# Patient Record
Sex: Female | Born: 1965 | Race: White | Hispanic: No | Marital: Married | Smoking: Current every day smoker
Health system: Southern US, Community
[De-identification: ages and names within clinical notes are randomized; demographics above are authoritative.]

## PROBLEM LIST (undated history)

## (undated) DIAGNOSIS — D649 Anemia, unspecified: Secondary | ICD-10-CM

## (undated) DIAGNOSIS — F419 Anxiety disorder, unspecified: Secondary | ICD-10-CM

## (undated) DIAGNOSIS — O903 Peripartum cardiomyopathy: Secondary | ICD-10-CM

## (undated) DIAGNOSIS — I509 Heart failure, unspecified: Secondary | ICD-10-CM

## (undated) DIAGNOSIS — I10 Essential (primary) hypertension: Secondary | ICD-10-CM

## (undated) HISTORY — DX: Anemia, unspecified: D64.9

## (undated) HISTORY — DX: Heart failure, unspecified: I50.9

## (undated) HISTORY — DX: Essential (primary) hypertension: I10

## (undated) HISTORY — PX: BUNIONECTOMY: SHX129

## (undated) HISTORY — DX: Peripartum cardiomyopathy: O90.3

## (undated) HISTORY — DX: Anxiety disorder, unspecified: F41.9

---

## 2001-05-23 DIAGNOSIS — O903 Peripartum cardiomyopathy: Secondary | ICD-10-CM

## 2001-05-23 HISTORY — DX: Peripartum cardiomyopathy: O90.3

## 2001-09-25 ENCOUNTER — Other Ambulatory Visit: Admission: RE | Admit: 2001-09-25 | Discharge: 2001-09-25 | Payer: Self-pay | Admitting: Obstetrics and Gynecology

## 2002-01-10 ENCOUNTER — Encounter (HOSPITAL_COMMUNITY): Admission: AD | Admit: 2002-01-10 | Discharge: 2002-02-09 | Payer: Self-pay | Admitting: Obstetrics and Gynecology

## 2002-02-12 ENCOUNTER — Encounter (HOSPITAL_COMMUNITY): Admission: RE | Admit: 2002-02-12 | Discharge: 2002-02-22 | Payer: Self-pay | Admitting: Obstetrics and Gynecology

## 2002-02-14 ENCOUNTER — Inpatient Hospital Stay (HOSPITAL_COMMUNITY): Admission: AD | Admit: 2002-02-14 | Discharge: 2002-02-14 | Payer: Self-pay | Admitting: Obstetrics and Gynecology

## 2002-02-24 ENCOUNTER — Inpatient Hospital Stay (HOSPITAL_COMMUNITY): Admission: AD | Admit: 2002-02-24 | Discharge: 2002-02-28 | Payer: Self-pay | Admitting: Obstetrics and Gynecology

## 2002-03-05 ENCOUNTER — Encounter: Payer: Self-pay | Admitting: Emergency Medicine

## 2002-03-05 ENCOUNTER — Inpatient Hospital Stay (HOSPITAL_COMMUNITY): Admission: EM | Admit: 2002-03-05 | Discharge: 2002-03-08 | Payer: Self-pay | Admitting: Emergency Medicine

## 2002-03-07 ENCOUNTER — Encounter: Payer: Self-pay | Admitting: Internal Medicine

## 2002-04-01 ENCOUNTER — Other Ambulatory Visit: Admission: RE | Admit: 2002-04-01 | Discharge: 2002-04-01 | Payer: Self-pay | Admitting: Obstetrics and Gynecology

## 2002-05-17 ENCOUNTER — Emergency Department (HOSPITAL_COMMUNITY): Admission: EM | Admit: 2002-05-17 | Discharge: 2002-05-17 | Payer: Self-pay | Admitting: Emergency Medicine

## 2002-06-25 ENCOUNTER — Inpatient Hospital Stay (HOSPITAL_COMMUNITY): Admission: EM | Admit: 2002-06-25 | Discharge: 2002-06-29 | Payer: Self-pay | Admitting: Psychiatry

## 2002-06-25 ENCOUNTER — Encounter: Payer: Self-pay | Admitting: Emergency Medicine

## 2002-11-21 ENCOUNTER — Inpatient Hospital Stay (HOSPITAL_COMMUNITY): Admission: EM | Admit: 2002-11-21 | Discharge: 2002-11-24 | Payer: Self-pay | Admitting: Psychiatry

## 2003-03-11 ENCOUNTER — Ambulatory Visit (HOSPITAL_BASED_OUTPATIENT_CLINIC_OR_DEPARTMENT_OTHER): Admission: RE | Admit: 2003-03-11 | Discharge: 2003-03-11 | Payer: Self-pay | Admitting: Orthopedic Surgery

## 2003-10-21 ENCOUNTER — Other Ambulatory Visit: Admission: RE | Admit: 2003-10-21 | Discharge: 2003-10-21 | Payer: Self-pay | Admitting: Obstetrics and Gynecology

## 2003-11-10 ENCOUNTER — Emergency Department (HOSPITAL_COMMUNITY): Admission: EM | Admit: 2003-11-10 | Discharge: 2003-11-10 | Payer: Self-pay | Admitting: Emergency Medicine

## 2004-04-06 ENCOUNTER — Encounter (INDEPENDENT_AMBULATORY_CARE_PROVIDER_SITE_OTHER): Payer: Self-pay | Admitting: *Deleted

## 2004-04-06 ENCOUNTER — Ambulatory Visit (HOSPITAL_COMMUNITY): Admission: RE | Admit: 2004-04-06 | Discharge: 2004-04-06 | Payer: Self-pay | Admitting: Gastroenterology

## 2005-03-22 ENCOUNTER — Other Ambulatory Visit: Admission: RE | Admit: 2005-03-22 | Discharge: 2005-03-22 | Payer: Self-pay | Admitting: Obstetrics and Gynecology

## 2007-12-01 ENCOUNTER — Emergency Department (HOSPITAL_COMMUNITY): Admission: EM | Admit: 2007-12-01 | Discharge: 2007-12-01 | Payer: Self-pay | Admitting: Emergency Medicine

## 2008-11-03 ENCOUNTER — Emergency Department (HOSPITAL_COMMUNITY): Admission: EM | Admit: 2008-11-03 | Discharge: 2008-11-04 | Payer: Self-pay | Admitting: Emergency Medicine

## 2010-03-11 ENCOUNTER — Other Ambulatory Visit: Admission: RE | Admit: 2010-03-11 | Discharge: 2010-03-11 | Payer: Self-pay | Admitting: Family Medicine

## 2010-10-08 NOTE — Op Note (Signed)
NAME:  [name], [name]                           ACCOUNT NO.:  [id number]   MEDICAL RECORD NO.:  [id number]                   PATIENT TYPE:  AMB   LOCATION:  DSC                                  FACILITY:  [facility]   PHYSICIAN:  Leonides Grills, M.D.                  DATE OF BIRTH:  Jul 13, 1965   DATE OF PROCEDURE:  03/11/2003  DATE OF DISCHARGE:                                 OPERATIVE REPORT   PREOPERATIVE DIAGNOSES:  1. Bilateral hallux valgus.  2. Bilateral 2nd metatarsalgias.  3. Bilateral 2nd hammertoes.   POSTOPERATIVE DIAGNOSES:  1. Bilateral hallux valgus.  2. Bilateral 2nd metatarsalgias.  3. Bilateral 2nd hammertoes.   OPERATION:  1. Bilateral chevron bunionectomies.  2. Bilateral Weil 2nd metatarsal shortening osteotomies.  3. Bilateral 2nd toe MTP joint dorsal capsulotomy with collateral release.  4. Bilateral 2nd toe EDB to EDL transfer.  5. Stress x-rays bilateral feet.   ANESTHESIA:  General endotracheal tube.   SURGEON:  Leonides Grills, M.D.   ASSISTANT:  Lianne Cure, P.A.-C.   ESTIMATED BLOOD LOSS:  Minimal.   TOURNIQUET TIME:  Approximately 1 hour on each side.   COMPLICATIONS:  None, disposition stable to the PR.   INDICATIONS FOR PROCEDURE:  This is a 45 year old female who has had  longstanding medial great toe pain as well as plantar forefoot pain  underneath the 2nd metatarsal head with  a subtle hammertoe. She was  consented for the above procedure. All risks which include intervention,  neurovessel injury, persistent pain, worsening pain, stiffness, avascular  necrosis of the head, nonunion, malunion of hardware or failure and  arthritis were all explained and questions were encouraged and answered.   DESCRIPTION OF PROCEDURE:  The patient was brought to the operating room and  placed in the supine position. After adequate general endotracheal tube  anesthesia was administered as well as Ancef 1 gm IV piggyback the bilateral  lower extremities  were prepped and draped in a sterile manner over a  proximally  placed thigh tourniquet.   We started with the right side. The limb was gravity exsanguinated and the  tourniquet was elevated to 290 mmHg specifically on the right side. A  longitudinal incision midline over the medial aspect of the right great toe  MTP joint  was then made. Dissection was carried out through the skin. The  neurovascular structures were identified both superiorly and inferiorly and  protected throughout the case. An L-shaped capsulotomy was then made.   A lateral  capsular release was then done with the curved Beaver. A simple  bunionectomy was then performed with a sagittal saw. The  center of the head  was identified and a chevron osteotomy was then created with the sagittal  saw. The head was then translated proximally 3 to 4 mm laterally and then  fixed with a 2.0-mm fully threaded cortical screw using a 1.5-mm  drill hole  respectively, countersinking the head. This had excellent fixation.   The redundant bone was then removed with the sagittal saw. The Rocky Link Johnson's  ridge was rounded off with a rongeur. The area was copiously irrigated with  normal saline. The toe had excellent range of motion. The capsule was then  advanced superiorly and proximally  and sutured with 2-0 Vicryl suture.   We then made a longitudinal incision over the 2nd toe. Dissection was  carried down through the skin. The extensor digitorum longus was tenotomized  proximal medial and the brevis distal lateral. A dorsal capsulotomy with  collateral release was then performed with a #15 blade  scalpel.   We then with a stacked sagittal saw blade made an osteotomy dorsal  intraarticular parallel with the plantar aspect of the foot. Once the  osteotomy was done the head was then translated proximally 3 mm proximal.  This was then fixed with a 12-mm long [place] partially threaded screw.  This had excellent fixation. The redundant  bone dorsally was then rongeured  off with a rongeur.   The wound was irrigated copiously with normal saline. The extensor digitorum  brevis was transferred to the extensor digitorum longus with 3-0 Maxon  suture. Stress x-rays were obtained on the right side and showed excellent  alignment of all fixation as well as correction. The wounds were copiously  irrigated with normal saline. The skin was closed with 4-0 nylon suture.  Prior to this closure the tourniquet was deflated and hemostasis was  obtained. The same exact procedure was performed on the left side as well,  both of which were done without complication.   The toes pinked up pink and beautiful with excellent capillary refill.  Dressings were applied. A Roger Mann dressing was applied. A hard sole shoe  was applied to both extremities. The patient was stable to the PR.                                               Leonides Grills, M.D.    PB/MEDQ  D:  03/11/2003  T:  03/11/2003  Job:  [id number]

## 2010-10-08 NOTE — Discharge Summary (Signed)
NAME:  [name], [name] NO.:  [id number]   MEDICAL RECORD NO.:  [id number]                   PATIENT TYPE:  IPS   LOCATION:  0505                                 FACILITY:  [facility]   PHYSICIAN:  Geoffery Lyons, M.D.                   DATE OF BIRTH:  08-13-1965   DATE OF ADMISSION:  06/25/2002  DATE OF DISCHARGE:  06/29/2002                                 DISCHARGE SUMMARY   CHIEF COMPLAINT AND PRESENT ILLNESS:  This was the first admission to [facility] for this 45 year old female voluntarily admitted,  referred by Dr. Milagros Evener as she used up her Xanax early, 2 mg 3-4 times  daily four months ago.  Was tapering down but still used up the prescription  early.  Also admits to alcohol use/abuse since age 5.  Longest time sober  unclear.  Denies suicidal or homicidal ideation, auditory or visual  hallucinations.  Admits to decreased appetite and chronic insomnia.   PAST PSYCHIATRIC HISTORY:  Dr. Evelene Croon, outpatient.  This is the first  inpatient.  History of anorexia and purging in the past.  Has had a history  of overdose in the distant past.   ALCOHOL/DRUG HISTORY:  As already stated, alcohol and Xanax.  Denies any  other substances.   PAST MEDICAL HISTORY:  Hypertension, chronic congestive heart failure.   MEDICATIONS:  Xanax 0.5 mg three times a day, Zoloft 200 mg every day, Coreg  and Altace 10 mg daily.   PHYSICAL EXAMINATION:  Performed and failed to show any acute findings.   MENTAL STATUS EXAM:  Tearful, disheveled, anxious female resistant to the  interview.  Somewhat agitated, detoxing, irritable.  Speech normal  production and tempo.  Affect anxious, tearful.  Mood irritable, anxious.  Thought processes logical and coherent.  Denied any suicidal or homicidal  ideation.  No evidence of delusional ideas.  Cognition well-preserved.   ADMISSION DIAGNOSES:   AXIS I:  Alcohol and benzodiazepine abuse; rule out dependence.   AXIS II:  No diagnosis.   AXIS III:  1. Hypertension.  2. Postpartum.  3. Congestive heart failure.   AXIS IV:  Moderate.   AXIS V:  Global Assessment of Functioning upon admission 35; highest Global  Assessment of Functioning in the last year 62.   LABORATORY DATA:  Thyroid profile within normal limits.  Other labs were  within normal limits.   HOSPITAL COURSE:  She was admitted and started intensive individual and  group psychotherapy.  She was detoxified using phenobarbital.  She was  extremely agitated, reminding that she will be discharged.  The next day,  she was admitting to the uncontrolled use of Xanax to handle the anxiety,  using Xanax five times a day and then some extra ones and she admitted that  she has little tolerance to anxiety or any physical discomfort.  She  would  take up to 17 Xanax in 24 hours.  She also used alcohol, although she  minimized.  Her mood was anxious, depressed.  Her affect was anxious,  depressed, overwhelmed with everything that was going on, unable to handle  the anxiety.  We continued to detox using phenobarbital.  She was given some  Zyprexa.  Continued to evidence anxiety, agitation, building up to acute  panic, feeling out of control.  Not sleeping well.  Worrying.  She was  motivated to get herself together as she had her 59-month-old baby.  She was  given Zyprexa and Vistaril together successfully.  She was started then on  Neurontin 100 mg three times a day.  She was seen by internal medicine for  further medication and medical management.  There was a session with the  husband where she denied any suicidal or homicidal ideation.  As she  continued to be detoxed, she became much more stable.  Her mood became  euthymic.  Her affect was bright, broad.  She was not complaining anymore of  the anxiety or the panic.  So after the session with the husband went well,  she was discharged to outpatient follow-up.   DISCHARGE DIAGNOSES:    AXIS I:  1. Benzodiazepine and alcohol dependence.  2. Anxiety disorder not otherwise specified.   AXIS II:  No diagnosis.   AXIS III:  1. Hypertension.  2. Postpartum.  3. Chronic congestive heart failure.   AXIS IV:  Moderate.   AXIS V:  Global Assessment of Functioning upon discharge 55.   DISCHARGE MEDICATIONS:  1. Coreg 18.75 mg twice a day.  2. Zoloft 200 mg daily.  3. Altace 10 mg twice a day.  4. Neurontin 300 mg three times a day.  5. Zyprexa Zydis 5 mg every six hours as needed.  6. Vistaril 50 mg every six hours as needed.   FOLLOW UP:  Milagros Evener, M.D. and Sallye Ober _______ for psychotherapy.                                               Geoffery Lyons, M.D.    IL/MEDQ  D:  07/31/2002  T:  08/01/2002  Job:  [id number]

## 2010-10-08 NOTE — Op Note (Signed)
[name], [name]                           ACCOUNT NO.:  [id number]   MEDICAL RECORD NO.:  [id number]                   PATIENT TYPE:  INP   LOCATION:  NA                                   FACILITY:  [facility]   PHYSICIAN:  James A. Ashley Royalty, M.D.             DATE OF BIRTH:  20-Jul-1965   DATE OF PROCEDURE:  02/25/2002  DATE OF DISCHARGE:                                 OPERATIVE REPORT   PREOPERATIVE DIAGNOSES:  1. Intrauterine pregnancy at [redacted] weeks gestation.  2. Arrest disorder of dilatation in labor.   POSTOPERATIVE DIAGNOSES:  1. Intrauterine pregnancy at [redacted] weeks gestation.  2. Arrest disorder of dilatation in labor.   PROCEDURE:  Primary low transverse cesarean section.   SURGEON:  Rudy Jew. Ashley Royalty, M.D.   ANESTHESIA:  Epidural and general.   FINDINGS:  An 8 pound 5 ounce female.  Apgars 8 at one minute, 9 at five  minutes.  Sent to the newborn nursery.   ESTIMATED BLOOD LOSS:  800 cc.   COMPLICATIONS:  None.   PACKS AND DRAINS:  Foley.   COUNTS:  Sponge, needle, and instrument count reported as correct x2.   PROCEDURE:  The patient was taken to the operating room and placed in the  dorsal supine position.  __________  epidural was placed.  Foley catheter  was also in place.  She was prepped and draped in the usual manner for  abdominal surgery.  Attempt was made to achieve surgical levels of  anesthesia with the epidural catheter.  However, this effort was not  successful.  Hence, the patient was ultimately placed under general  anesthesia.   A Pfannenstiel incision was made down to the level of the fascia.  The  fascia was nicked with the knife and incised transversely with Mayo  scissors.  The underlying rectus muscles were separated from the overlying  fascia using sharp and blunt dissection.  The rectus muscles were separated  in the midline exposing the peritoneum which was elevated with hemostats and  entered atraumatically with Metzenbaum scissors.  The  incision was extended  longitudinally.  The uterus was identified and bladder flap created by  incising the anterior uterine serosa and sharply and bluntly dissecting the  bladder inferiorly.  It was held in place with a bladder blade.  The uterus  was then entered through a low transverse incision using sharp and blunt  dissection.  The amniotic fluid was noted to be clear.  The infant was  delivered from a vertex presentation in an atraumatic manner.  The infant  was suctioned.  The cord was clamped, cut, and the infant given to the  waiting pediatrics team.  Cord blood was obtained.  The placenta and  membranes were then removed in their entirety.  The uterus was exteriorized.  The uterus was then closed in two running layers of number 1 Vicryl.  The  first was a running locking layer.  The second was a running, intermittently  locking, and embrocating layer.  Two additional figure-of-eight sutures were  required to obtain hemostasis.  Hemostasis was noted.  The uterus, tubes,  and ovaries were inspected and found to be normal and returned to the  abdominal cavity.  Copious irrigation was accomplished.  Hemostasis was  noted.   The fascia was then closed with 0 Vicryl in a running fashion.  The skin was  closed with staples.  At the conclusion of the procedure the urine was clear  and copious.  The patient was taken to the recovery room in excellent  condition.                                               James A. Ashley Royalty, M.D.    JAM/MEDQ  D:  02/26/2002  T:  02/26/2002  Job:  [id number]

## 2010-10-08 NOTE — Consult Note (Signed)
[name], [name]                           ACCOUNT NO.:  [id number]   MEDICAL RECORD NO.:  [id number]                   PATIENT TYPE:  INP   LOCATION:  0158                                 FACILITY:  [facility]   PHYSICIAN:  Meade Maw, M.D.                 DATE OF BIRTH:  February 21, 1966   DATE OF CONSULTATION:  03/05/2002  DATE OF DISCHARGE:                                   CONSULTATION   INDICATION FOR CONSULTATION:  Congestive heart failure.   HISTORY OF PRESENT ILLNESS:  The patient is a very pleasant 45 year old  female who has recently underwent a cesarean section at 40 weeks' gestation.  She was noted to have significant hypertension during her third trimester.  The patient has a 3-year history of hypertension treated by Dr. Quintella Reichert with  Ziac prior to her pregnancy.  During the pregnancy her Ziac was discontinued  and she was subsequently started on Aldomet.  The Aldomet was increased to  500 mg t.i.d.  The patient is unable to relate what her blood pressure  typically ran throughout pregnancy.  During her cesarean section she was  noted that her blood pressure was up, approximately 180/110.  On Wednesday  and Thursday she noted the sudden onset of the PND and orthopnea.  She  attributed this to anxiety.  She was also noted to have increased ankle  swelling and her blood pressure remained elevated throughout her postpartum  period.  On the night of presentation at midnight, she said that she became  severely short of breath and was unable to lay flat.  She felt as if she was  drowning.  She complained of a subscapular pain but no real chest pain.  Her  blood pressure in the emergency room was noted to be 190/130.  She was  treated with IV Lasix and sublingual nitroglycerin and subsequently started  on IV nitro.  Since she has been given the nitro, she has developed a severe  headache to the point that it is almost debilitating.  A transthoracic echo  has been performed.  The  echo was of poor quality.  She does appear to have  an ejection fraction of approximately 30%.  Her left ventricle is mild to  moderately dilated.  She has been diuresed with a total of 60 mg of Lasix  today for a total of 3400 cc.  Her shortness of breath has significantly  improved.  Her chief complaint at this time, as noted above, is that of  severe headache.  She has a history of tobacco and alcohol abuse prior to  her pregnancy.   PAST MEDICAL HISTORY:  As noted above.  1. One week postpartum.  2. Tobacco abuse prior to pregnancy.  3. Alcohol abuse prior to pregnancy.  4. Hypertension.  5. Panic disorder.   MEDICATIONS:  Prior to this admission include Aldomet 500 mg t.i.d. and  prenatal vitamins.   CURRENT MEDICATIONS:  1. IV nitroglycerin.  2. Altace 5 mg p.o. b.i.d.  3. Xanax p.r.n.  4. Zoloft 25 mg p.o. q.d.  5. Potassium supplement.  6. Morphine p.r.n.  7. Tylenol p.r.n.   ALLERGIES:  No known drug allergies.   PAST SURGICAL HISTORY:  Significant for cesarean section, as noted above.   FAMILY HISTORY:  Father has a history of CABG with dyslipidemia, died from  lung cancer.  Mother had somatization disorder.  Brother had increased  cholesterol.   SOCIAL HISTORY:  She smoked two packs per day, stopped when she became  pregnant.  Alcohol prior to pregnancy.  She denies drug abuse.   REVIEW OF SYSTEMS:  As noted above.   PHYSICAL EXAMINATION:  GENERAL:  Middle-aged female with significant  emotional distress.  She is tearful throughout the interview.  VITAL SIGNS:  Her systolic pressure was ranging from 160-170 with a  diastolic of 90s-120, heart rate of 84-110.  O2 saturation is 96% on room  air.  HEENT:  Unremarkable.  NECK:  There is no evidence of neck vein distention.  She has good carotid  upstrokes.  No carotid bruits.  Thyroid is not palpable.  PULMONARY:  Breath sounds which are equal.  There are mild crackles noted at  the base.  CARDIOVASCULAR:   Regular rate and rhythm.  There is no obvious gallop noted.  ABDOMEN:  Post gravid state.  She has normal bowel sounds.  EXTREMITIES:  No peripheral edema.  NEUROLOGICAL:  Nonfocal.  SKIN:  Warm and dry.   LABORATORY DATA:  Her CK peak is 155 with an MB of 6.2.  Her troponin I is  slightly elevated at 0.05.  Her BNP is elevated at 1750.  Her white count is  9.2, hematocrit 31, platelet count has increased from 295 to 771.  Potassium  has decreased from admission from 3.4 to 2.9.   OTHER DIAGNOSTIC DATA:  Transthoracic echo is as noted above.  Chest x-ray  reveals pulmonary edema.  EKG reveals sinus tachycardia.  There was no  inducible ischemia.   IMPRESSION:  1. 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  Other etiologies to     consider are hypertensive cardiomyopathy as well as alcohol-induced     cardiomyopathy.  At this time, we will continue with aggressive medical     treatment including IV diuresis, hypertensive control.  Her Altace has     been increased to 10 mg b.i.d.  She has been ordered Vasotec p.r.n. and     will continue with aggressive diuresis.  Will obtain a repeat BNP to     monitor her therapy.  Additionally, she has been started on Lopressor 25     mg b.i.d. Will discontinue the nitroglycerin as ischemic etiology is the     likelihood.  Will consider a left and right heart catheterization should     she not have significant improvement in her ejection fraction once she is     stabilized.  The patient has been counseled that should she wish to     continue breast feeding, she should discard all milk during this period.  2.     Hypertension.  Increase in Altace, as noted above.  Aggressive diuresis and      increase in Lopressor.  She will need aggressive replacement for her     hypokalemia.  3. Headaches.  Will discontinue the nitro as noted above, add Darvocet-N 100  p.r.n.                                              Meade Maw, M.D.     HP/MEDQ  D:  03/05/2002  T:  03/05/2002  Job:  [id number]   cc:   Lacretia Leigh. Quintella Reichert, M.D.   James A. Ashley Royalty, M.D.  [address]  [place], [place] [address]  Fax: [phone]

## 2010-10-08 NOTE — Discharge Summary (Signed)
NAME:  [name], [name]                           ACCOUNT NO.:  [id number]   MEDICAL RECORD NO.:  [id number]                   PATIENT TYPE:  IPS   LOCATION:  0508                                 FACILITY[facility]   PHYSICIAN:  Geoffery Lyons, M.D.                   DATE OF BIRTH:  1966-03-28   DATE OF ADMISSION:  11/21/2002  DATE OF DISCHARGE:  11/24/2002                                 DISCHARGE SUMMARY   CHIEF COMPLAINT AND PRESENT ILLNESS:  This was the second admission to [facility] for this 45 year old married white female voluntarily  admitted.  History of alcohol and drug use.  Has been drinking beer and wine  and drinking shots throughout the night.  Has been smoking crack cocaine.  Feeling very depressed.  A friend recently died.  Her sleep has been poor.  Has had an intentional weight loss of 50 pounds.  She takes Tylenol, up to  four liquid gel tabs, to sleep.  Denies any suicidal or homicidal thoughts  or psychosis.   PAST PSYCHIATRIC HISTORY:  Second time admitted.  Detoxed from Xanax.  Saw  Dr. Donell Beers on an outpatient basis.  History of alcohol detox in the past.   ALCOHOL/DRUG HISTORY:  Has been drinking beer, wine, drinking shots and  smoking crack cocaine.   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   MEDICATIONS:  Altace 10 mg twice a day, Lexapro 20 mg and Klonopin 1 mg  twice a day.   PHYSICAL EXAMINATION:  Performed and failed to show any acute findings.   MENTAL STATUS EXAM:  Alert female.  Cooperative, casually dressed with fair  eye contact.  Speech is clear.  Mood is depressed.  Affect is flat.  Thought  processes are coherent.  No evidence of psychosis.  Cognition well-  preserved.  Minimizing the alcohol use.   ADMISSION DIAGNOSES:   AXIS I:  1. Alcohol, benzodiazepine and cocaine dependence.  2. Mood disorder not otherwise specified.   AXIS II:  No diagnosis.   AXIS  III:  1. Congestive heart failure.  2. Hypertension.   AXIS IV:  Moderate.   AXIS V:  Global Assessment of Functioning upon admission 35; highest Global  Assessment of Functioning in the last year 70.   LABORATORY DATA:  Blood chemistries within normal limits.  Thyroid profile  within normal limits.   HOSPITAL COURSE:  She was admitted and started intensive individual and  group psychotherapy.  She was detoxified using Librium.  She was given some  Vistaril, some trazodone as well as some Symmetrel.  She was given some  Seroquel as needed.  She was placed on Lexapro 20 mg per day, Altace 10 mg  twice a day, Seroquel 50 mg at bedtime.  Lexapro was increased to 25 mg per  day.  She was  started on Neurontin 100 mg four times a day and she was given  Seroquel 100 mg as needed for anxiety.  She was, throughout the stay,  minimizing the pattern of substance use.  She apparently was trying to  control drinking but, at the same time, she was using the Klonopin she was  being prescribed.  She went up on the Klonopin, up to 5 mg per day.  She  developed tolerance to the alcohol and the Klonopin, so she started using  cocaine.  Did not see a problem with the Klonopin being prescribed to her,  in spite of having had to be detoxed on Xanax before and the fact that she  was already abusing the Klonopin.  She minimized, rationalized, projected,  blamed, was easily agitated.  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  On November 24, 2002, she had settled down.  She was willing to  give it a try to be off the Klonopin.  Did feel better with the medications  that were provided for her.  She was going to go back to see Dr. Donell Beers and  will give this information to him.  Upon discharge, in full contact with  reality.  No suicidal or homicidal ideation.  Claimed to be motivated to  abstain from all substances.    DISCHARGE DIAGNOSES:   AXIS I:  1. Polysubstance dependence.  2. Mood disorder not otherwise specified.  3. Anxiety disorder not otherwise specified.   AXIS II:  No diagnosis.   AXIS III:  1. Congestive heart failure.  2. Hypertension.   AXIS IV:  Moderate.   AXIS V:  Global Assessment of Functioning upon discharge 50.   DISCHARGE MEDICATIONS:  1. Symmetrel 100 mg twice a day.  2. Altace 10 mg twice a day.  3. Lexapro 20 mg, 1-1/2 daily.  4. Neurontin 100 mg four times a day.  5. Trazodone 150 mg, 1-2 at bedtime as needed for sleep.  6. Seroquel 100 mg every six hours as needed.   FOLLOW UP:  Dr. Donell Beers.                                               Geoffery Lyons, M.D.    IL/MEDQ  D:  12/18/2002  T:  12/18/2002  Job:  [id number]

## 2010-10-08 NOTE — Discharge Summary (Signed)
[name], [name]                           ACCOUNT NO.:  [id number]   MEDICAL RECORD NO.:  [id number]                   PATIENT TYPE:   LOCATION:                                       FACILITY:   PHYSICIAN:  Rudy Jew. Ashley Royalty, M.D.             DATE OF BIRTH:  02-Nov-1965   DATE OF ADMISSION:  02/24/2002  DATE OF DISCHARGE:  02/28/2002                                 DISCHARGE SUMMARY   DISCHARGE DIAGNOSES:  1. Intrauterine pregnancy at 40 weeks, delivery.  2. Chronic hypertension.  3. Advanced maternal age.  4. Previous smoker.  5. History of transfusion.  6. Apparent umbilical hernia.  7. Arrest disorder of dilatation in labor necessitating cesarean section.   OPERATIONS/SPECIAL PROCEDURES:  Primary low transverse cesarean section.   CONSULTATIONS:  None.   DISCHARGE MEDICATIONS:  Chromagen, Aldomet, Percocet, Motrin.   HISTORY AND PHYSICAL:  This is a 45 year old prima gravida at [redacted] weeks  gestation.  Prenatal care was complicated by the aforementioned risk  factors.  The patient has been taking Aldomet 500 mg p.o. b.i.d. for the  majority of the pregnancy.  She just recently increased to a t.i.d. regimen.  She stopped smoking several months prior to delivery.  She is also Group B  strep culture positive.  She presented to triage on the day of admission  complaining of flashes in her vision.  She denied any frank diplopia,  scotomata, or other symptoms of severe pregnancy-induced hypertension.  She  had some discomfort beneath her right breast which resolved after arrival at  the hospital.  Physical examination revealed her blood pressure to be  162/86; later 150/73.  Vital signs were stable.  Cervical examination  revealed the cervix to be long, closed, and posterior.   HOSPITAL COURSE:  The patient was admitted to [facility] of  [place].  The patient's laboratory studies were normal.  She was given  Cytotec and Pitocin.  She went on to labor and dilated as  far as 8 cm and  minus 1 to minus 2 station.  Unfortunately she developed an arrest disorder  of dilatation which necessitated cesarean section; hence, on February 25, 2002, she was taken to the operating room and underwent primary low  transverse cesarean section.  The procedure was accomplished under epidural  anesthesia.  She delivered an 8-pound 5-ounce female, Apgars 8 at 1 minute and  9 at 5 minutes, and sent to newborn nursery.  The operation was  uncomplicated.  On February 26, 2002, the patient complained of tightness in  her chest and trouble breathing.  She felt she was having a panic attack  which she has had many times in the past.  The symptoms seemed to abate with  a dose of Xanax.  The patient was monitored closely and the symptoms did not  recur.  On February 28, 2002, she was felt stable for discharge and  was  discharged in satisfactory condition.   ACCESSORY CLINICAL FINDINGS:  Hemoglobin/hematocrit initial of 11.2 and  32.3, respectively.  Repeat values obtained throughout the hospitalization,  the last which was  performed on February 27, 2002, and yielded  values of 8.6 and 24.4, respectively.  A CM-24 revealed normal SGOT and  SGPT.  Fibrinogen was 540.  Urine culture revealed no growth.   DISPOSITION:  The patient is to return to [facility] in four to six  weeks for postpartum evaluation.                                               James A. Ashley Royalty, M.D.    JAM/MEDQ  D:  05/15/2002  T:  05/16/2002  Job:  [id number]

## 2010-10-08 NOTE — Discharge Summary (Signed)
[name], [name] NO.:  [id number]   MEDICAL RECORD NO.:  [id number]                   PATIENT TYPE:  INP   LOCATION:  2037                                 FACILITY:  [facility]   PHYSICIAN:  Meade Maw, M.D.                 DATE OF BIRTH:  11-06-1965   DATE OF ADMISSION:  03/06/2002  DATE OF DISCHARGE:  03/08/2002                                 DISCHARGE SUMMARY   PHYSICIANS:  Primary care Blakelynn Scheeler is Lacretia Leigh. Quintella Reichert, M.D.  OB/GYN is  Rudy Jew. Ashley Royalty, M.D. of [facility]: [facility].   PROCEDURES:  1. (03/05/02) 2 D echocardiogram revealing moderately dilated LV with mildly     to moderately decreased LV systolic function, EF estimated 35-45%.  Mild     MR.  2. (03/07/02) MUGA radionuclide study with first ejection fraction     calculated at 25% and second EF calculated at 28%.  Diffuse decrease in     contraction of the left ventricular wall, particularly the apicoseptal     region.  Average EF of 26.5%.  Findings consistent with cardiomyopathy.   DISCHARGE DIAGNOSES:  1. 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  Other     etiologies to consider are hypertensive cardiomyopathy as well as alcohol-     induced cardiomyopathy.  The patient was aggressively diuresed and multi-     drug regimen initiated for adequate hypertensive control.  Future     consideration will be given to a left and right heart catheterization     should she not have improvement in her ejection fraction once stabilized.  2. Hypertension.  Medications adjusted with good control by time of     discharge.  3. Hypokalemia, diuresis-induced.  Supplemented and within good range at     time of discharge.  4. History of tobacco and alcohol use prior to pregnancy.  Has abstained     successfully since.  5. History of panic disorder.  Started on Zoloft prior to pregnancy.  She     was continued on p.r.n. Xanax and will continue on Xanax with  reinitiation of Zoloft at time of discharge.  6. Elevated alkaline phosphatase, questionably related to hepatic congestion     from congestive heart failure.  7. Mild elevation in troponin I and relative index of cardiac enzymes,     likely related to congestive heart failure.   DISCHARGE MEDICATIONS:  1. Altace 10 mg p.o. b.i.d.  2. K-Dur 20 mEq one p.o. q.d.  3. Lopressor 100 mg p.o. b.i.d.  4. Furosemide 40 mg one p.o. b.i.d. for three days, then decrease to one     tablet p.o. q.d.  5. Zoloft 25 mg p.o. q.d.  6. Xanax 0.5 mg one-half to one tablet p.o. q.6h. p.r.n. anxiety.   CONDITION ON DISCHARGE:  Stable.   DISCHARGE INSTRUCTIONS:  Activity:  As tolerated.  Diet:  Limit sodium  intake to less than 3000 mg per day.  Do not eat servings containing greater  than 300 mg of sodium per serving.  Emphasize use of fresh/frozen vegetables  over canned veggies and fresh fruits.   SPECIAL INSTRUCTIONS:  The patient to call our clinic with any concerns or  problems or shortness of breath.  Patient instructed to check her weight  every day after voiding and before breakfast and bring in those lists of  weights to her office visits with her.  She is instructed to purchase a  blood pressure monitoring kit, checking her blood pressure three or four  times per week and bringing those lists of measurements in with her to  office visits.   FOLLOW-UP:  Tuesday, 10/21, at 1:15 p.m. to see Dr. Fraser Din.  She will stop  by the lab, [address], at 12:45 prior to office visit for drawing BMET.  On  Tuesday, 10/28, at 2 p.m., will see Katrine Coho in our congestive heart  failure clinic.   HISTORY OF PRESENT ILLNESS:  The patient is a pleasant 45 year old female  with a three-year history of hypertension treated by Dr. Quintella Reichert.  It had  been well-controlled on Ziac 5 mg per day.  When she became pregnant, she  was advised to discontinue her Ziac.  In the third trimester she was on  Aldomet, currently  500 mg t.i.d.  The patient is status post delivery of a  female child by C-section 02/24/02 under general anesthesia.  At that time her  blood pressure was approximately 180/110.  After discharge she noticed  sudden onset of PND/orthopnea attributed to anxiety.  She later had  subsequent episode of severe PND and increased lower extremity swelling.  The recurrent episode of orthopnea and PND precipitated ER trip to [facility], where her blood pressure was found to be 190/130, heart rate  approximately 130.   HOSPITAL COURSE:  1. 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  She was seen by cardiology (Dr. Meade Maw), who     noted low EF, approximately 30%, by transthoracic echo, with mild to     moderately dilated LV.  She had good diuresis with IV Lasix with     significant  improvement in her shortness of breath.  She did have a     nitroglycerin-induced headache.  Initial BNP was 1750 on 10/14 earlier     that morning and later that evening BNP was increased to 2820.     Medications were adjusted, including increase of her Altace, addition of     Lopressor to regimen.  A MUGA scan revealed EF averaging 26.5% with     diffuse hypokinesis compatible with cardiomyopathy.  The evening 10/16     the patient complained of shortness of breath with clinical signs of CHF.     She was diuresed with improvement.  On day of discharge her lung sounds     were clear.  She was felt to be compensated and was discharged to home in     stable condition.  Please note that her admission weight was 177 pounds;     weight at time of discharge was 164 pounds.   1. HYPOKALEMIA:  Diuretic-induced, supplemented p.r.n.  Her serum potassium     was 4.5 at time of discharge.  Her serum potassium was as low as 2.8     10/14.  1. NITROGLYCERIN-INDUCED HEADACHE:  Resolved  with discontinuation of     nitrates.   LABORATORY DATA:  On 10/14, cholesterol of 265, triglycerides 270, HDL 48,  LDL of 163, VLDL of 54.  Admission sodium of 141, potassium 3.4; serum  potassium as low as 2.8, which was supplemented.  Serum potassium of 4.5 day  of discharge.  Chloride 105, CO2 of 27, glucose 93, BUN 16, creatinine 0.6,  which remained stable during course of admission.  LFTs within normal range  though slightly elevated alkaline phosphatase at time of admission of 146.  Albumin low at 3.1.  Calcium 9.4.  On 10/14 her WBC was 9.2, hemoglobin  10.8, hematocrit 31.5, platelets elevated at 771.  Differential essentially  within normal range.  This admission, first CK of 155, MB fraction 6.2,  troponin I of 0.04.  BNP of 1750.  Second CK of 117, MB fraction of 5,  troponin I of 0.05.  Third CK of 94, MB fraction 4.2, troponin I 0.05.  Follow-up BNP later in the evening 10/14 was elevated at 2820.    NOTE:  Total time preparing for discharge greater than 40 minutes, including  dictating this discharge summary, filling out and reviewing discharge  instructions with the patient, calling in prescriptions, and arranging  follow-up office visits.       Salomon Fick, N.P.                       Meade Maw, M.D.    MES/MEDQ  D:  03/08/2002  T:  03/08/2002  Job:  [id number]   cc:   Lacretia Leigh. Quintella Reichert, M.D.   James A. Ashley Royalty, M.D.  [address]  [place], [place] [address]  Fax: [phone]

## 2010-10-08 NOTE — H&P (Signed)
NAME:  [name], MCNORTON                           ACCOUNT NO.:  [id number]   MEDICAL RECORD NO.:  [id number]                   PATIENT TYPE:  IPS   LOCATION:  0508                                 FACILITY:  [facility]   PHYSICIAN:  Geoffery Lyons, M.D.                   DATE OF BIRTH:  1965/11/01   DATE OF ADMISSION:  11/21/2002  DATE OF DISCHARGE:  11/24/2002                         PSYCHIATRIC ADMISSION ASSESSMENT   IDENTIFYING INFORMATION:  This is a 45 year old married white female  voluntarily admitted on November 21, 2002.   HISTORY OF PRESENT ILLNESS:  The patient presents with a history of alcohol  and drug use.  Has been drinking beer and wine and drinking shots,  drinking throughout the night.  She has been smoking crack cocaine, feeling  very depressed.  Her friend had recently died.  Her sleep has been poor.  She has had an intentional weight loss of 50 pounds but wants to lose more.  The patient states she takes Tylenol, up to four liquid gel tablets, to  sleep.  She denies any suicidal or homicidal thoughts or psychosis and  states that will attend meetings after discharge to remain sober.   PAST PSYCHIATRIC HISTORY:  Second hospitalization.  Was here in October,  where she was detoxed from Xanax.  She sees Dr. Donell Beers as an outpatient and  also has a therapist.  The patient has no history of being detoxed from  alcohol in the past.  Her longest history of sobriety has been one year.   SOCIAL HISTORY:  This is a 44 year old married white female.  Married for  five years.  Has a 61-month-old child.  Her husband is supportive.  She lives  with her husband and child.  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  She  reports no legal problems.  She has completed some college courses.  Denies  any DUIs.   FAMILY HISTORY:  Mother with bipolar.  Grandmother with bipolar.  Father  with bipolar.   ALCOHOL/DRUG HISTORY:  The patient smokes.  Has been drinking beer, wine,  drinking shots.  Her  last drink was on November 21, 2002.   PRIMARY CARE PHYSICIAN:  Caroline Sauger, M.D., in [place].   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   MEDICATIONS:  Altace 10 mg b.i.d., Lexapro 20 mg (has been on that for five  months but has not been on any antidepressant for the past two months),  Klonopin 1 mg b.i.d.   ALLERGIES:  No known allergies.   PHYSICAL EXAMINATION:  Done at [facility].  The  patient, today, appears in no acute distress.   LABORATORY DATA:  Her urine drug screen was positive for cocaine, positive  for benzodiazepines, positive for THC.  Alcohol level was 14.  CBC within  normal limits.   MENTAL STATUS  EXAM:  She was an alert, young female.  Cooperative, casually  dressed.  Fair eye contact.  Speech is clear.  Mood is depressed.  Affect is  flat.  Thought processes are coherent.  No evidence of psychosis.  Cognitive  function intact.  Judgment is poor.  Insight is fair.  Seems to be  minimizing her alcohol and drug use.   DIAGNOSES:   AXIS I:  1. Polysubstance abuse.  2. Rule out bipolar disorder.   AXIS II:  Deferred.   AXIS III:  1. Congestive heart failure.  2. Hypertension.   AXIS IV:  Other psychosocial problems, medical problems.   AXIS V:  Current 35; past year 70.   PLAN:  Voluntary admission for drug use and depression.  Contract for  safety.  Check every 15 minutes.  Will initiate the low dose Librium to  detox from alcohol and benzodiazepines.  Will continue with patient's other  medications and monitor her vital signs closely.  Will check labs.  The  patient is to increase coping skills by attending groups.  Will have a  family session with husband for support and discharge planning.  The patient  is to remain alcohol and drug-free, to attend AA/NA meetings after  discharge.  To be medication-compliant.  To follow up with Dr. Donell Beers and  therapist.    TENTATIVE LENGTH OF STAY:  Three to five days.     Landry Corporal, N.P.                       Geoffery Lyons, M.D.    JO/MEDQ  D:  12/09/2002  T:  12/09/2002  Job:  [id number]

## 2010-10-08 NOTE — Op Note (Signed)
[name], [name]                 ACCOUNT NO.:  [id number]   MEDICAL RECORD NO.:  [id number]          PATIENT TYPE:  AMB   LOCATION:  ENDO                         [facility]AC[facility]   PHYSICIAN:  Danise Edge, M.D.   DATE OF BIRTH:  [birth year]   DATE OF PROCEDURE:  04/06/2004  DATE OF DISCHARGE:                                 OPERATIVE REPORT   PROCEDURE:  Diagnostic colonoscopy.   INDICATIONS:  [name] is a 45 year old female born 1966/02/05. Ms.  Wilkinson is undergoing a diagnostic colonoscopy to evaluate intermittent bloody  diarrhea. Her mother had Crohn's disease. Her father was diagnosed with  colon cancer.   ENDOSCOPIST:  Danise Edge, M.D.   PREMEDICATION:  Versed 10 mg, Demerol 100 mg.   PROCEDURE NOTE:  After obtaining informed consent, [name] was placed in  the left lateral decubitus position. I administered intravenous Demerol and  intravenous Versed to achieve conscious sedation for the procedure. The  patient's blood pressure, oxygen saturation, and cardiac rhythm were  monitored throughout the procedure and documented in the medical record.   Anal inspection and digital rectal exam were normal. The Olympus adjustable  pediatric colonoscopy was introduced into the rectum and advanced to the  cecum. A normal appearing ileocecal valve was intubated, and the distal  ileum inspected. Colonic preparation for exam today was excellent.   Rectum:  Normal.   Sigmoid colon and descending colon:  Normal.   Splenic flexure:  Normal.   Transverse colon:  Normal.   Hepatic flexure:  Normal.   Ascending colon:  Normal.   Cecum and ileocecal valve:  Normal.   Distal ileum:  Normal.   BIOPSIES:  Four biopsies were taken from the right colon and four biopsies  were taken from the left colon to rule out microscopic colitis.   ASSESSMENT:  Normal proctocolonoscopy with distal ileoscopy. Random colonic  biopsies to rule out microscopic colitis  pending.      MJ/MEDQ  D:  04/06/2004  T:  04/06/2004  Job:  [id number]   cc:   Holley Bouche, M.D.  [address]  [place], [place] [address]  Fax: [phone]

## 2010-12-09 ENCOUNTER — Emergency Department (HOSPITAL_COMMUNITY): Admission: EM | Admit: 2010-12-09 | Payer: Self-pay | Source: Home / Self Care

## 2011-06-23 ENCOUNTER — Other Ambulatory Visit: Payer: Self-pay | Admitting: Family Medicine

## 2011-06-23 ENCOUNTER — Ambulatory Visit
Admission: RE | Admit: 2011-06-23 | Discharge: 2011-06-23 | Disposition: A | Discharge status: Home / Self Care | Payer: BC Managed Care – PPO | Source: Ambulatory Visit | Attending: Family Medicine | Admitting: Family Medicine

## 2011-06-23 DIAGNOSIS — R109 Unspecified abdominal pain: Secondary | ICD-10-CM

## 2011-12-26 ENCOUNTER — Ambulatory Visit: Payer: BC Managed Care – PPO | Admitting: Emergency Medicine

## 2011-12-26 ENCOUNTER — Encounter: Payer: Self-pay | Admitting: Physician Assistant

## 2011-12-26 ENCOUNTER — Ambulatory Visit: Payer: BC Managed Care – PPO

## 2011-12-26 VITALS — BP 149/90 | HR 84 | Temp 98.0°F | Resp 16

## 2011-12-26 DIAGNOSIS — M79609 Pain in unspecified limb: Secondary | ICD-10-CM

## 2011-12-26 DIAGNOSIS — S8390XA Sprain of unspecified site of unspecified knee, initial encounter: Secondary | ICD-10-CM

## 2011-12-26 DIAGNOSIS — M79604 Pain in right leg: Secondary | ICD-10-CM

## 2011-12-26 MED ORDER — HYDROCODONE-ACETAMINOPHEN 5-325 MG PO TABS: 1.0000 | ORAL_TABLET | Freq: Four times a day (QID) | ORAL | Status: AC | PRN

## 2011-12-26 NOTE — Progress Notes (Signed)
  Subjective:    Patient ID: [name], female    DOB: 15-Feb-1966, 46 y.o.   MRN: [id number]  HPI This 46 y.o. Female presents for evaluation of severe pain of the lower right leg.  She fell yesterday afternoon about 4 pm, while playing kickball.  Reports that she heard a loud pop and had severe pain just below the knee on the outer aspect.  Any movement of the leg increases her pain, as does wiggling her toes and moving the ankle.  She is unable to bear weight because of the pain.   Past Medical History  Diagnosis Date  . Hypertension   . Postpartum cardiomyopathy 2003  . Anxiety     Past Surgical History  Procedure Date  . Bunionectomy     bilateral    Prior to Admission medications   Medication Sig Start Date End Date Taking? Authorizing Provider  LISINOPRIL-HYDROCHLOROTHIAZIDE PO Take by mouth daily.   Yes Historical Provider, MD    No Known Allergies  History   Social History  . Marital Status: Married    Number of Children: 1   Social History Main Topics  . Smoking status: Current Everyday Smoker -- 1.0 packs/day    Types: Cigarettes  . Smokeless tobacco: Not on file  . Alcohol Use: Yes  . Drug Use: No  . Sexually Active: Yes   Family History  Problem Relation Age of Onset  . Arthritis Mother   . Cancer Father   . Hypertension Father   . Heart disease Father     Review of Systems     Objective:   Physical Exam Blood pressure 149/90, pulse 84, temperature 98 F (36.7 C), temperature source Oral, resp. rate 16. There is no height or weight on file to calculate BMI. Well-developed, well nourished WF who is awake, alert and oriented, in NAD while resting in the wheelchair.  Any movement of the right leg causes pain and she cries out and pushes the examiner away. Her husband and son are present. HEENT: [place]/AT, sclera and conjunctiva are clear.   Lungs: normal Extremities: Trace edema infero-lateral to the right knee.  No ecchymosis, erythema or abrasions.  Normal pedal pulses and capillary refill is <3 seconds.  The knee itself is non-tender but the proximal head of the fibula is exquisitely tender.  There is tenderness of the posterolateral aspect of the soft tissue surrounding the proximal fibula (posterior ligament of the fibular head, arcuate popliteal ligament). Skin: warm and dry without rash.   [facility] reading (PRIMARY) by  Dr. Cleta Alberts.  Questionable fracture of the proximal fibula.  Seen only on one view, so doubtful.       Assessment & Plan:   1. Leg pain, right  DG Tibia/Fibula Right, DG Knee 1-2 Views Right, HYDROcodone-acetaminophen (NORCO) 5-325 MG per tablet  2. Sprain of knee/leg     Knee Immobilizer and crutches.  Non-weight bearing for now.  Reassess on 12/30/2011, sooner if needed.  Discussed with Dr. Cleta Alberts.

## 2011-12-26 NOTE — Patient Instructions (Signed)
Use the crutches to keep the weight off the injured leg.   Return for re-evaluation on Friday 12/30/2011, sooner if you are unable to raise the toes/foot on the right (foot drop).

## 2011-12-30 ENCOUNTER — Telehealth: Payer: Self-pay

## 2011-12-30 NOTE — Telephone Encounter (Signed)
Pt would like a call re: her recent x-rays.  [phone].

## 2011-12-30 NOTE — Telephone Encounter (Signed)
[facility] [address] would like this done, she stated she does not want to go through the weekend in pain, I advised we are open through the weekend and we will let her know as soon as we can about her Rx request.

## 2011-12-30 NOTE — Telephone Encounter (Signed)
Spoke with patient and explained that the xray report is back from the radiologist and it was normal.  Reminded patient of follow up plan: return today 12/30/11 and that provider would be going over report more thoroughly with patient.  Patient prefers to be referred to [facility].  And would like a refill on her pain med.  Informed patient that she would most likely need to come in for re-evaluation before more pain medicine could be written but would speak with a provider about her requests for both an ortho referral and the pain med refill.  We will call her back and inform her of plan.

## 2011-12-31 NOTE — Telephone Encounter (Signed)
Pt is wanting to know the status of her refill, and would like a phone call back regarding this.

## 2011-12-31 NOTE — Telephone Encounter (Signed)
She was supposed to come in 8/09 for re-evaluation.  If she is still needing the pain med, she definitely needs re-evaluation.

## 2011-12-31 NOTE — Telephone Encounter (Signed)
[facility] notifying patient to RTC.

## 2013-01-31 ENCOUNTER — Emergency Department (HOSPITAL_COMMUNITY): Payer: BC Managed Care – PPO

## 2013-01-31 ENCOUNTER — Emergency Department (HOSPITAL_COMMUNITY)
Admission: EM | Admit: 2013-01-31 | Discharge: 2013-01-31 | Disposition: A | Discharge status: Home / Self Care | Payer: BC Managed Care – PPO | Attending: Emergency Medicine | Admitting: Emergency Medicine

## 2013-01-31 ENCOUNTER — Encounter (HOSPITAL_COMMUNITY): Payer: Self-pay | Admitting: *Deleted

## 2013-01-31 DIAGNOSIS — Z79899 Other long term (current) drug therapy: Secondary | ICD-10-CM | POA: Insufficient documentation

## 2013-01-31 DIAGNOSIS — R062 Wheezing: Secondary | ICD-10-CM

## 2013-01-31 DIAGNOSIS — J069 Acute upper respiratory infection, unspecified: Secondary | ICD-10-CM | POA: Insufficient documentation

## 2013-01-31 DIAGNOSIS — I10 Essential (primary) hypertension: Secondary | ICD-10-CM | POA: Insufficient documentation

## 2013-01-31 DIAGNOSIS — F17200 Nicotine dependence, unspecified, uncomplicated: Secondary | ICD-10-CM | POA: Insufficient documentation

## 2013-01-31 DIAGNOSIS — F411 Generalized anxiety disorder: Secondary | ICD-10-CM | POA: Insufficient documentation

## 2013-01-31 DIAGNOSIS — IMO0002 Reserved for concepts with insufficient information to code with codable children: Secondary | ICD-10-CM | POA: Insufficient documentation

## 2013-01-31 LAB — BASIC METABOLIC PANEL
BUN: 5 mg/dL — ABNORMAL LOW (ref 6–23)
Chloride: 104 mEq/L (ref 96–112)
GFR calc Af Amer: >90 mL/min (ref >90)
Glucose, Bld: 96 mg/dL (ref 70–99)
Potassium: 3.6 mEq/L (ref 3.5–5.1)
Sodium: 138 mEq/L (ref 135–145)

## 2013-01-31 LAB — CBC
HCT: 40.4 % (ref 36.0–46.0)
Hemoglobin: 14 g/dL (ref 12.0–15.0)
RDW: 12.5 % (ref 11.5–15.5)
WBC: 10.5 10*3/uL (ref 4.0–10.5)

## 2013-01-31 LAB — POCT I-STAT TROPONIN I: Troponin i, poc: 0 ng/mL (ref 0.00–0.08)

## 2013-01-31 MED ORDER — PREDNISONE 20 MG PO TABS
60.0000 mg | ORAL_TABLET | Freq: Once | ORAL | Status: AC
  Administered 2013-01-31: 60 mg via ORAL
  Filled 2013-01-31: qty 3

## 2013-01-31 MED ORDER — ALBUTEROL SULFATE HFA 108 (90 BASE) MCG/ACT IN AERS
6.0000 | INHALATION_SPRAY | Freq: Once | RESPIRATORY_TRACT | Status: AC
Start: 2013-01-31 — End: 2013-01-31
  Administered 2013-01-31: 6 via RESPIRATORY_TRACT
  Filled 2013-01-31 (×2): qty 6.7

## 2013-01-31 MED ORDER — PREDNISONE 20 MG PO TABS: ORAL_TABLET | ORAL | Status: DC

## 2013-01-31 NOTE — ED Provider Notes (Signed)
CSN: [id number]     Arrival date & time 01/31/13  1117 History   First MD Initiated Contact with Patient 01/31/13 1203     Chief Complaint  Patient presents with  . Cough  . Nasal Congestion   (Consider location/radiation/quality/duration/timing/severity/associated sxs/prior Treatment) Patient is a 47 y.o. female presenting with cough. The history is provided by the patient. No language interpreter was used.  Cough Cough characteristics:  Productive Sputum characteristics:  Green Severity:  Moderate Onset quality:  Unable to specify Duration:  2 weeks Timing:  Constant Progression:  Unchanged Chronicity:  New Smoker: yes   Context: sick contacts, smoke exposure and upper respiratory infection   Relieved by:  Nothing Worsened by:  Nothing tried Ineffective treatments:  Decongestant Associated symptoms: chest pain, chills and shortness of breath   Associated symptoms: no diaphoresis, no eye discharge, no fever, no headaches, no myalgias, no rhinorrhea and no sore throat   Chest pain:    Quality:  Aching   Severity:  Moderate   Onset quality:  Unable to specify   Duration:  2 days   Timing:  Constant   Progression:  Unchanged   Chronicity:  New Shortness of breath:    Severity:  Mild   Onset quality:  Unable to specify   Duration:  2 weeks   Timing:  Intermittent   Progression:  Waxing and waning   Past Medical History  Diagnosis Date  . Hypertension   . Postpartum cardiomyopathy 2003  . Anxiety    Past Surgical History  Procedure Laterality Date  . Bunionectomy      bilateral   Family History  Problem Relation Age of Onset  . Arthritis Mother   . Cancer Father   . Hypertension Father   . Heart disease Father    History  Substance Use Topics  . Smoking status: Current Every Day Smoker -- 1.00 packs/day    Types: Cigarettes  . Smokeless tobacco: Never Used  . Alcohol Use: Yes   OB History   Grav Para Term Preterm Abortions TAB SAB Ect Mult Living             Review of Systems  Constitutional: Positive for chills. Negative for fever, diaphoresis, activity change, appetite change and fatigue.  HENT: Negative for congestion, sore throat, facial swelling, rhinorrhea, neck pain and neck stiffness.   Eyes: Negative for photophobia and discharge.  Respiratory: Positive for cough and shortness of breath. Negative for chest tightness.   Cardiovascular: Positive for chest pain. Negative for palpitations and leg swelling.  Gastrointestinal: Negative for nausea, vomiting, abdominal pain and diarrhea.  Endocrine: Negative for polydipsia and polyuria.  Genitourinary: Negative for dysuria, frequency, difficulty urinating and pelvic pain.  Musculoskeletal: Negative for myalgias, back pain and arthralgias.  Skin: Negative for color change and wound.  Allergic/Immunologic: Negative for immunocompromised state.  Neurological: Negative for facial asymmetry, weakness, numbness and headaches.  Hematological: Does not bruise/bleed easily.  Psychiatric/Behavioral: Negative for confusion and agitation.    Allergies  Review of patient's allergies indicates no known allergies.  Home Medications   Current Outpatient Rx  Name  Route  Sig  Dispense  Refill  . ALPRAZolam (XANAX) 0.5 MG tablet   Oral   Take 0.5 mg by mouth 3 (three) times daily as needed for anxiety.         . Chlorphen-Pseudoephed-APAP (TYLENOL ALLERGY SINUS PO)   Oral   Take 2 capsules by mouth daily as needed.         Marland Kitchen  citalopram (CELEXA) 40 MG tablet   Oral   Take 40 mg by mouth daily.         Marland Kitchen DM-Doxylamine-Acetaminophen (NYQUIL COLD & FLU PO)   Oral   Take 30 mLs by mouth at bedtime as needed (for cold).         Marland Kitchen lisinopril-hydrochlorothiazide (PRINZIDE,ZESTORETIC) 20-12.5 MG per tablet   Oral   Take 1 tablet by mouth daily.         . predniSONE (DELTASONE) 20 MG tablet      3 tabs po day one, then 2 po daily x 4 days   11 tablet   0    BP 130/73   Pulse 75  Temp(Src) 98.5 F (36.9 C) (Oral)  Resp 22  Ht 5\' 4"  (1.626 m)  Wt 155 lb (70.308 kg)  BMI 26.59 kg/m2  SpO2 97%  LMP 01/10/2013 Physical Exam  Constitutional: She is oriented to person, place, and time. She appears well-developed and well-nourished. No distress.  HENT:  Head: Normocephalic and atraumatic.  Mouth/Throat: No oropharyngeal exudate.  Eyes: Pupils are equal, round, and reactive to light.  Neck: Normal range of motion. Neck supple.  Cardiovascular: Normal rate, regular rhythm and normal heart sounds.  Exam reveals no gallop and no friction rub.   No murmur heard. Pulmonary/Chest: Effort normal. No respiratory distress. She has wheezes in the right upper field, the right middle field, the right lower field, the left upper field, the left middle field and the left lower field. She has no rales.  Abdominal: Soft. Bowel sounds are normal. She exhibits no distension and no mass. There is no tenderness. There is no rebound and no guarding.  Musculoskeletal: Normal range of motion. She exhibits no edema and no tenderness.  Neurological: She is alert and oriented to person, place, and time.  Skin: Skin is warm and dry.  Psychiatric: She has a normal mood and affect.    ED Course  Procedures (including critical care time) Labs Review Labs Reviewed  BASIC METABOLIC PANEL - Abnormal; Notable for the following:    BUN 5 (*)    Creatinine, Ser 0.49 (*)    All other components within normal limits  PRO B NATRIURETIC PEPTIDE - Abnormal; Notable for the following:    Pro B Natriuretic peptide (BNP) 184.5 (*)    All other components within normal limits  CBC  POCT I-STAT TROPONIN I   Imaging Review Dg Chest 2 View  01/31/2013   *RADIOLOGY REPORT*  Clinical Data: Cough  CHEST - 2 VIEW  Comparison: November 10, 2003.  Findings: Cardiomediastinal silhouette appears normal.  No acute pulmonary disease is noted.  Bony thorax is intact.  IMPRESSION: No acute cardiopulmonary  abnormality seen.   Original Report Authenticated By: Lupita Raider.,  M.D.    Date: 01/31/2013  Rate: 61  Rhythm: normal sinus rhythm  QRS Axis: normal  Intervals: normal  ST/T Wave abnormalities: TW flattening throughout, TWI III  Conduction Disutrbances: none  Narrative Interpretation: unchanged from prior      MDM   Pt is a 47 y.o. female with Pmhx as above who presents with about 2 weeks of cough, congestion, wheezing, chest tightness.  On PE, afebrile, VSS, scattered wheezing thoughout.  EKG unremarkable, trop, bnp not significantly elevated.  CXR clear.  I believe pt has viral URI exacerbating by wheezing likely due to undiagnosed chronic lung disease.  Pt somewhat improved by albertol.  Will d/c home w/ albuterol, 5d prednisone. Return precautions  given for new or worsening symptoms including worsening SOB, CP, development of fever.   1. URI (upper respiratory infection)   2. Wheezing       Shanna Cisco, MD 01/31/13 [phone]

## 2013-01-31 NOTE — ED Notes (Signed)
Pt states for the past 2 weeks she's had productive cough, green/yellow/brown sputum, and congestion. Pt does have a strong/congested cough in triage.

## 2014-06-03 ENCOUNTER — Ambulatory Visit (INDEPENDENT_AMBULATORY_CARE_PROVIDER_SITE_OTHER): Payer: BLUE CROSS/BLUE SHIELD | Admitting: Family Medicine

## 2014-06-03 ENCOUNTER — Ambulatory Visit (INDEPENDENT_AMBULATORY_CARE_PROVIDER_SITE_OTHER): Payer: BLUE CROSS/BLUE SHIELD

## 2014-06-03 VITALS — BP 114/72 | HR 79 | Temp 97.9°F | Resp 18 | Ht 64.0 in | Wt 157.0 lb

## 2014-06-03 DIAGNOSIS — M545 Low back pain: Secondary | ICD-10-CM

## 2014-06-03 DIAGNOSIS — M533 Sacrococcygeal disorders, not elsewhere classified: Secondary | ICD-10-CM

## 2014-06-03 DIAGNOSIS — T148 Other injury of unspecified body region: Secondary | ICD-10-CM

## 2014-06-03 DIAGNOSIS — T148XXA Other injury of unspecified body region, initial encounter: Secondary | ICD-10-CM

## 2014-06-03 MED ORDER — CYCLOBENZAPRINE HCL 5 MG PO TABS: 5.0000 mg | ORAL_TABLET | Freq: Every evening | ORAL | Status: DC | PRN

## 2014-06-03 MED ORDER — HYDROCODONE-ACETAMINOPHEN 5-325 MG PO TABS: 1.0000 | ORAL_TABLET | Freq: Three times a day (TID) | ORAL | Status: DC | PRN

## 2014-06-03 MED ORDER — MELOXICAM 15 MG PO TABS: 15.0000 mg | ORAL_TABLET | Freq: Every day | ORAL | Status: DC

## 2014-06-03 NOTE — Progress Notes (Signed)
Chief Complaint:  Chief Complaint  Patient presents with  . Back Pain    fell on dec 27  . Back Injury    HPI: Lillard AnesSharon E Wilkinson is a 49 y.o. female who is here for  Low back and coccyx pain 6-8/10 , she fell 2 weeks ago  onback porch , slipped on wet rain while walking neighbor's dog. She fell and then landed on her butt and also went down about 4-5 steps o nher butt cheek. She is afraid she  Has broken a tailbone, she has this before. She has a problem sitting on it or laying on it.  She has decrease sensation when she urinates, she has no incontinence, she has pain with BM so she tries not to go.  She felt ok overall but then her sacral and acocyz area continued to hurt so she is here to make sure she di dnot do anything.  She has tried rest and over the coutner medicine and sits on a foam pillow without releif. She is a little constipated but nothing abnormal.  No weakness, tingling, incontinence.   Past Medical History  Diagnosis Date  . Hypertension   . Postpartum cardiomyopathy 2003  . Anxiety   . CHF (congestive heart failure)   . Anemia    Past Surgical History  Procedure Laterality Date  . Bunionectomy      bilateral  . Cesarean section     History   Social History  . Marital Status: Married    Spouse Name: N/A    Number of Children: N/A  . Years of Education: N/A   Social History Main Topics  . Smoking status: Current Every Day Smoker -- 1.00 packs/day    Types: Cigarettes  . Smokeless tobacco: Never Used  . Alcohol Use: Yes  . Drug Use: No  . Sexual Activity: Yes   Other Topics Concern  . None   Social History Narrative   Family History  Problem Relation Age of Onset  . Arthritis Mother   . Cancer Father   . Hypertension Father   . Heart disease Father    No Known Allergies Prior to Admission medications   Medication Sig Start Date End Date Taking? Authorizing Provider  lisinopril-hydrochlorothiazide (PRINZIDE,ZESTORETIC) 20-12.5 MG per  tablet Take 1 tablet by mouth daily.   Yes Historical Provider, MD  ALPRAZolam Prudy Feeler(XANAX) 0.5 MG tablet Take 0.5 mg by mouth 3 (three) times daily as needed for anxiety.    Historical Provider, MD  Chlorphen-Pseudoephed-APAP (TYLENOL ALLERGY SINUS PO) Take 2 capsules by mouth daily as needed.    Historical Provider, MD  citalopram (CELEXA) 40 MG tablet Take 40 mg by mouth daily.    Historical Provider, MD  DM-Doxylamine-Acetaminophen (NYQUIL COLD & FLU PO) Take 30 mLs by mouth at bedtime as needed (for cold).    Historical Provider, MD  predniSONE (DELTASONE) 20 MG tablet 3 tabs po day one, then 2 po daily x 4 days Patient not taking: Reported on 06/03/2014 02/01/13   Toy CookeyMegan Docherty, MD     ROS: The patient denies fevers, chills, night sweats, unintentional weight loss, chest pain, palpitations, wheezing, dyspnea on exertion, nausea, vomiting, abdominal pain, dysuria, hematuria, melena,  weakness, or tingling.   All other systems have been reviewed and were otherwise negative with the exception of those mentioned in the HPI and as above.    PHYSICAL EXAM: Filed Vitals:   06/03/14 0934  BP: 114/72  Pulse: 79  Temp:  97.9 F (36.6 C)  Resp: 18   Filed Vitals:   06/03/14 0934  Height:  (1.626 m)  Weight: 157 lb (71.215 kg)   Body mass index is 26.94 kg/(m^2).  General: Alert, anxious white female HEENT:  Normocephalic, atraumatic, oropharynx patent. EOMI, PERRLA Cardiovascular:  Regular rate and rhythm, no rubs murmurs or gallops.  Radial pulse intact. No pedal edema.  Respiratory: Clear to auscultation bilaterally.  No wheezes, rales, or rhonchi.  No cyanosis, no use of accessory musculature GI: No organomegaly, abdomen is soft and non-tender, positive bowel sounds.  No masses. Skin: No rashes. Neurologic: Facial musculature symmetric. Psychiatric: Patient is appropriate throughout our interaction. Lymphatic: No cervical lymphadenopathy Musculoskeletal: Gait intact. Rectal exam  is normal, normal rectal tone + paramsk tenderness left low back and sacral area around piriformis, she has pain at SI jt No pain at greater troch, no swelling, ecchymosis or deformity, she has norl ER and IR  Full ROM 5/5 strength, 2/2 DTRs No saddle anesthesia Straight leg negative Hip and knee exam--normal     LABS: Results for orders placed or performed during the hospital encounter of 01/31/13  CBC  Result Value Ref Range   WBC 10.5 4.0 - 10.5 K/uL   RBC 4.14 3.87 - 5.11 MIL/uL   Hemoglobin 14.0 12.0 - 15.0 g/dL   HCT 16.1 09.6 - 04.5 %   MCV 97.6 78.0 - 100.0 fL   MCH 33.8 26.0 - 34.0 pg   MCHC 34.7 30.0 - 36.0 g/dL   RDW 40.9 81.1 - 91.4 %   Platelets 332 150 - 400 K/uL  Basic metabolic panel  Result Value Ref Range   Sodium 138 135 - 145 mEq/L   Potassium 3.6 3.5 - 5.1 mEq/L   Chloride 104 96 - 112 mEq/L   CO2 26 19 - 32 mEq/L   Glucose, Bld 96 70 - 99 mg/dL   BUN 5 (L) 6 - 23 mg/dL   Creatinine, Ser 7.82 (L) 0.50 - 1.10 mg/dL   Calcium 9.2 8.4 - 95.6 mg/dL   GFR calc non Af Amer >90 >90 mL/min   GFR calc Af Amer >90 >90 mL/min  Pro b natriuretic peptide (BNP)  Result Value Ref Range   Pro B Natriuretic peptide (BNP) 184.5 (H) 0 - 125 pg/mL  POCT i-Stat troponin I  Result Value Ref Range   Troponin i, poc 0.00 0.00 - 0.08 ng/mL   Comment 3             EKG/XRAY:   Primary read interpreted by Dr. Conley Rolls at [facility]. Neg for obvious fracture or dislocation     ASSESSMENT/PLAN: Encounter Diagnoses  Name Primary?  . Left low back pain, with sciatica presence unspecified Yes  . Sacral pain   . Contusion    Rx mobic, flexeril, norco prn F/u with xray results   Gross sideeffects, risk and benefits, and alternatives of medications d/w patient. Patient is aware that all medications have potential sideeffects and we are unable to predict every sideeffect or drug-drug interaction that may occur.  Jemima Petko PHUONG, DO 06/03/2014 10:51 AM

## 2014-12-04 ENCOUNTER — Telehealth: Payer: Self-pay | Admitting: Family Medicine

## 2015-03-04 NOTE — Telephone Encounter (Signed)
Error

## 2015-03-25 ENCOUNTER — Other Ambulatory Visit: Payer: Self-pay | Admitting: Physician Assistant

## 2015-03-25 ENCOUNTER — Ambulatory Visit
Admission: RE | Admit: 2015-03-25 | Discharge: 2015-03-25 | Disposition: A | Discharge status: Home / Self Care | Payer: 59 | Source: Ambulatory Visit | Attending: Physician Assistant | Admitting: Physician Assistant

## 2015-03-25 DIAGNOSIS — M25521 Pain in right elbow: Secondary | ICD-10-CM

## 2016-01-10 IMAGING — CR DG ELBOW COMPLETE 3+V*R*
4 series · 4 of 4 positions shown · non-contrast
Comparison: No prior.

CLINICAL DATA: Pain.  No known injury.  Initial evaluation.

EXAM:
RIGHT ELBOW - COMPLETE 3+ VIEW

[x elbow joint ap right]
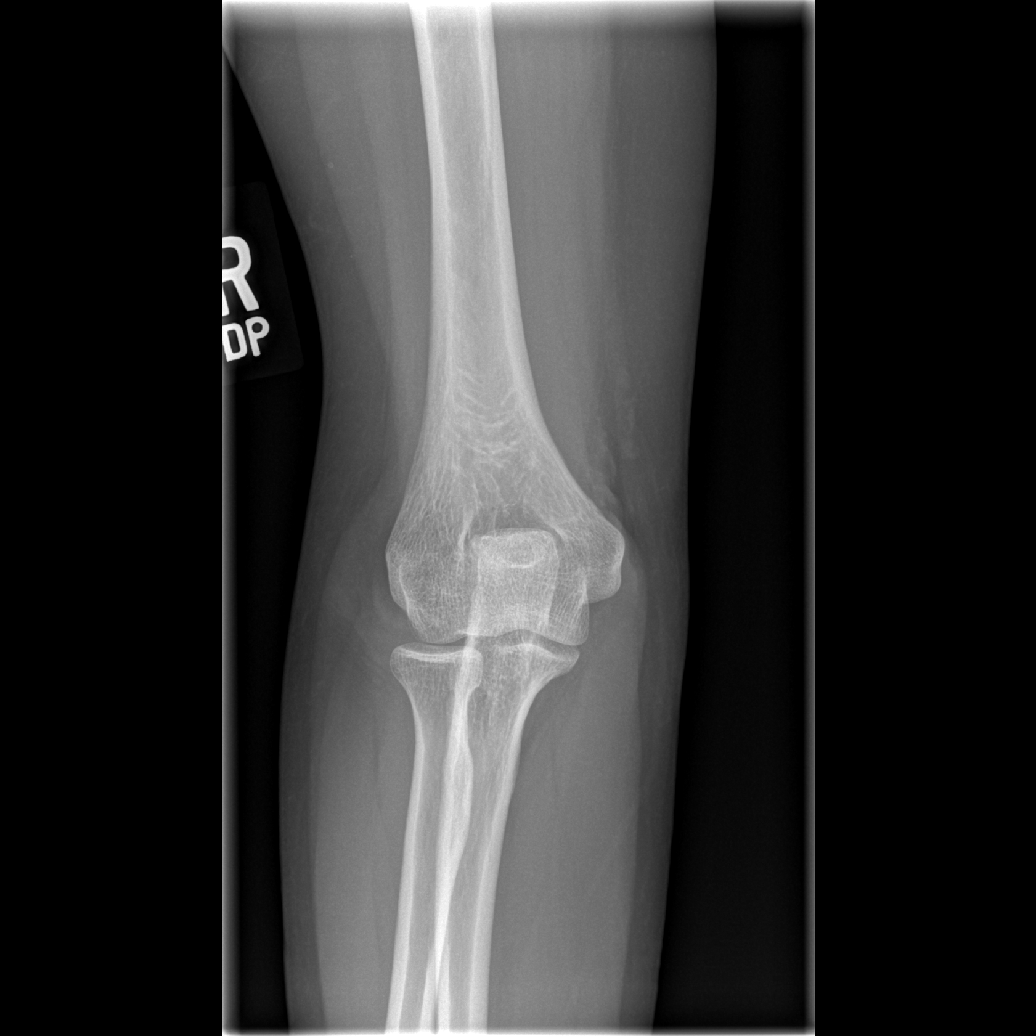

[x elbow joint obl. right (1 of 2)]
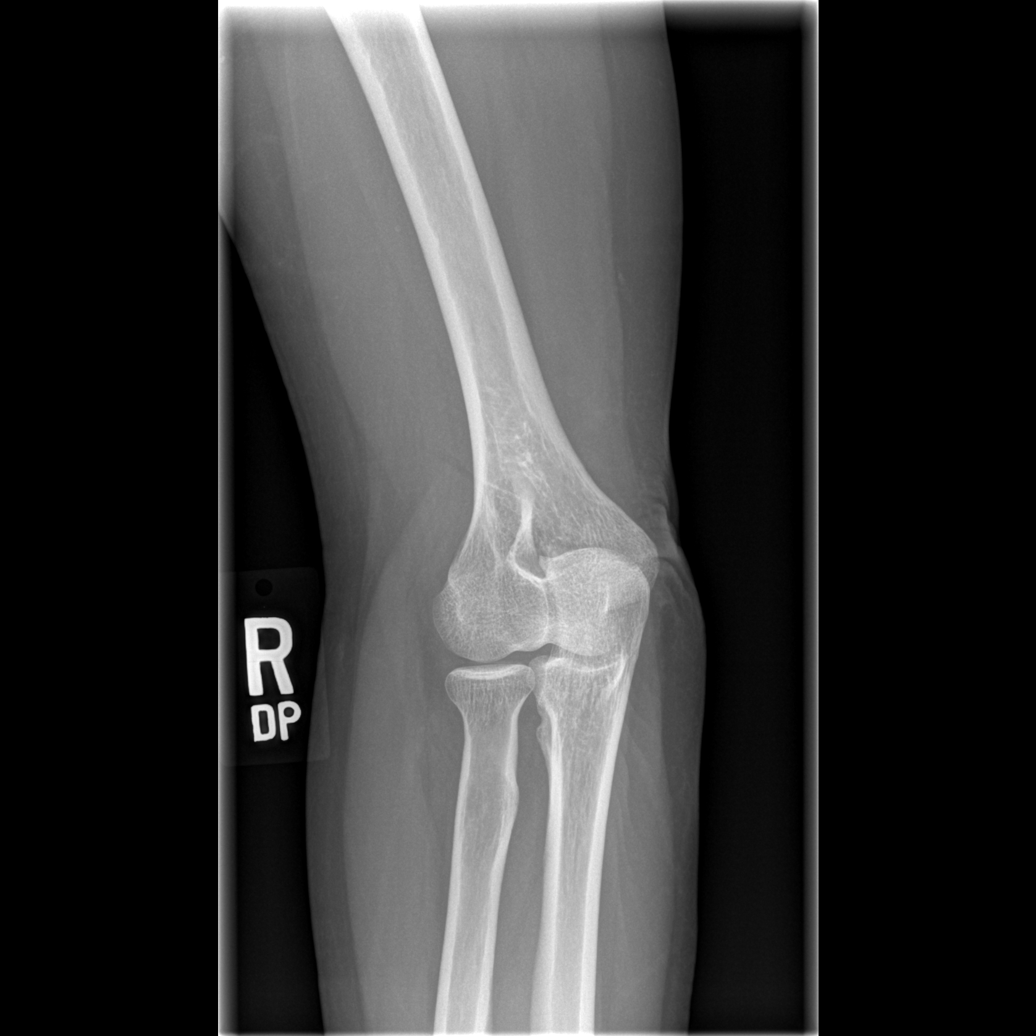

[x elbow joint obl. right (2 of 2)]
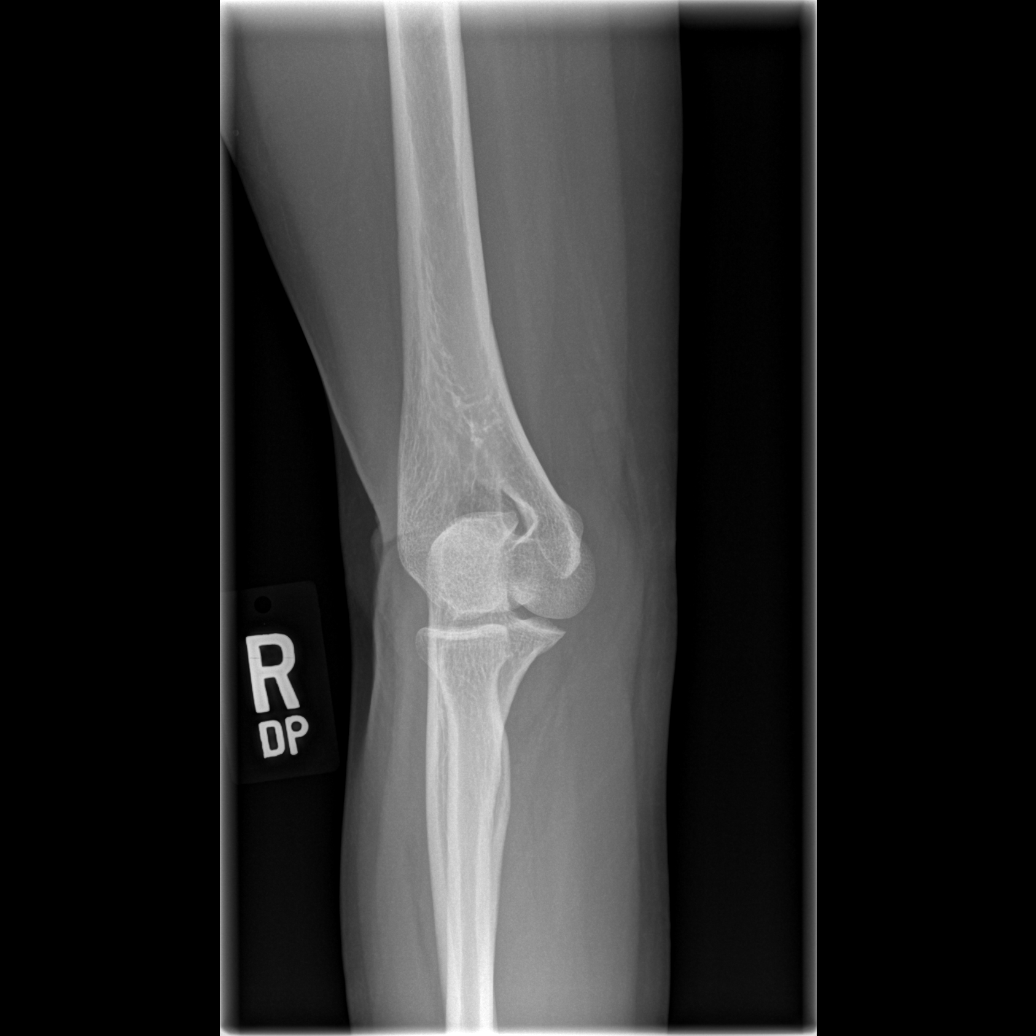

[x elbow joint lat right]
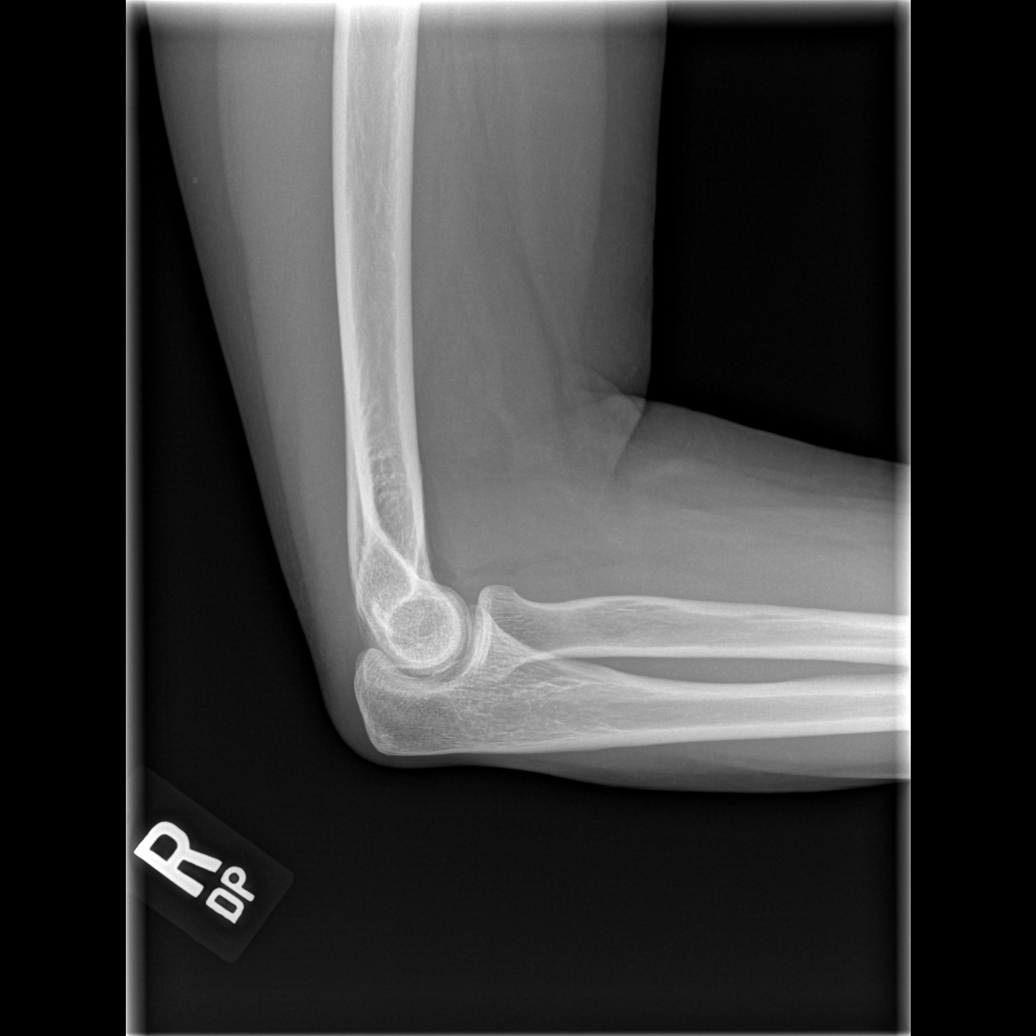

[4 of 4 positions shown; findings below may reference images not displayed]

FINDINGS: There is no evidence of fracture, dislocation, or joint effusion.
There is no evidence of arthropathy or other focal bone abnormality.
Soft tissues are unremarkable.
IMPRESSION: No acute bony or joint abnormality. No evidence of fracture or
dislocation.

## 2016-01-25 ENCOUNTER — Encounter (HOSPITAL_COMMUNITY): Payer: Self-pay | Admitting: Emergency Medicine

## 2016-01-25 ENCOUNTER — Emergency Department (HOSPITAL_COMMUNITY)
Admission: EM | Admit: 2016-01-25 | Discharge: 2016-01-25 | Disposition: A | Discharge status: Home / Self Care | Payer: BLUE CROSS/BLUE SHIELD | Attending: Emergency Medicine | Admitting: Emergency Medicine

## 2016-01-25 DIAGNOSIS — H578 Other specified disorders of eye and adnexa: Secondary | ICD-10-CM | POA: Diagnosis not present

## 2016-01-25 DIAGNOSIS — H5789 Other specified disorders of eye and adnexa: Secondary | ICD-10-CM

## 2016-01-25 DIAGNOSIS — F17210 Nicotine dependence, cigarettes, uncomplicated: Secondary | ICD-10-CM | POA: Insufficient documentation

## 2016-01-25 DIAGNOSIS — I110 Hypertensive heart disease with heart failure: Secondary | ICD-10-CM | POA: Diagnosis not present

## 2016-01-25 DIAGNOSIS — I509 Heart failure, unspecified: Secondary | ICD-10-CM | POA: Insufficient documentation

## 2016-01-25 DIAGNOSIS — H5712 Ocular pain, left eye: Secondary | ICD-10-CM | POA: Diagnosis present

## 2016-01-25 DIAGNOSIS — Z79899 Other long term (current) drug therapy: Secondary | ICD-10-CM | POA: Diagnosis not present

## 2016-01-25 MED ORDER — OXYCODONE-ACETAMINOPHEN 5-325 MG PO TABS
1.0000 | ORAL_TABLET | Freq: Once | ORAL | Status: AC
  Administered 2016-01-25: 1 via ORAL
  Filled 2016-01-25: qty 1

## 2016-01-25 MED ORDER — GATIFLOXACIN 0.5 % OP SOLN
1.0000 [drp] | OPHTHALMIC | Status: DC
  Administered 2016-01-25: 1 [drp] via OPHTHALMIC
  Filled 2016-01-25: qty 2.5

## 2016-01-25 MED ORDER — HYDROCODONE-ACETAMINOPHEN 5-325 MG PO TABS: 1.0000 | ORAL_TABLET | Freq: Four times a day (QID) | ORAL | 0 refills | Status: DC | PRN

## 2016-01-25 MED ORDER — FLUORESCEIN SODIUM 1 MG OP STRP
1.0000 | ORAL_STRIP | Freq: Once | OPHTHALMIC | Status: AC
  Administered 2016-01-25: 1 via OPHTHALMIC
  Filled 2016-01-25: qty 1

## 2016-01-25 MED ORDER — ATROPINE SULFATE 1 % OP SOLN
2.0000 [drp] | Freq: Once | OPHTHALMIC | Status: AC
  Administered 2016-01-25: 2 [drp] via OPHTHALMIC
  Filled 2016-01-25: qty 2

## 2016-01-25 MED ORDER — TETRACAINE HCL 0.5 % OP SOLN
2.0000 [drp] | Freq: Once | OPHTHALMIC | Status: AC
  Administered 2016-01-25: 2 [drp] via OPHTHALMIC
  Filled 2016-01-25: qty 4

## 2016-01-25 NOTE — ED Provider Notes (Signed)
[facility]-EMERGENCY DEPT Provider Note   CSN: [id number] Arrival date & time: 01/25/16  1012  By signing my name below, I, Linna Darner, attest that this documentation has been prepared under the direction and in the presence of non-physician practitioner, Jaynie Crumble, PA-C. Electronically Signed: Linna Darner, Scribe. 01/25/2016. 12:07 PM.  History   Chief Complaint Chief Complaint  Patient presents with  . Eye Pain    The history is provided by the patient. No language interpreter was used.     HPI Comments: [name] is a 50 y.o. female who presents to the Emergency Department complaining of sudden onset, constant, severe left eye pain beginning 2 days ago. She endorses associated redness and states she cannot open her left eye due to pain. Pt states she slept with her contacts in 2 nights ago and has experienced worsening left eye pain and redness since. She notes she has slept with her contacts in in the past but has never experienced eye pain or redness of the same severity. Pt does not know if there is a foreign body in her left eye. She denies eye discharge, numbness, weakness, headache, or any other associated symptoms. Pt has an ophthalmologist.  Past Medical History:  Diagnosis Date  . Anemia   . Anxiety   . CHF (congestive heart failure) (HCC)   . Hypertension   . Postpartum cardiomyopathy 2003    There are no active problems to display for this patient.   Past Surgical History:  Procedure Laterality Date  . BUNIONECTOMY     bilateral  . CESAREAN SECTION      OB History    No data available       Home Medications    Prior to Admission medications   Medication Sig Start Date End Date Taking? Authorizing Provider  cyclobenzaprine (FLEXERIL) 5 MG tablet Take 1 tablet (5 mg total) by mouth at bedtime as needed for muscle spasms. 06/03/14   Thao P Le, DO  HYDROcodone-acetaminophen (NORCO) 5-325 MG per tablet Take 1 tablet by mouth every 8 (eight) hours  as needed for severe pain. Take with stool softener, drink lots of water. 06/03/14   Thao P Le, DO  lisinopril-hydrochlorothiazide (PRINZIDE,ZESTORETIC) 20-12.5 MG per tablet Take 1 tablet by mouth daily.    Historical Provider, MD  meloxicam (MOBIC) 15 MG tablet Take 1 tablet (15 mg total) by mouth daily. No other NSAIDs, take with food 06/03/14   Thao P Le, DO    Family History Family History  Problem Relation Age of Onset  . Arthritis Mother   . Cancer Father   . Hypertension Father   . Heart disease Father     Social History Social History  Substance Use Topics  . Smoking status: Current Every Day Smoker    Packs/day: 1.00    Types: Cigarettes  . Smokeless tobacco: Never Used  . Alcohol use Yes     Allergies   Review of patient's allergies indicates no known allergies.   Review of Systems Review of Systems  Eyes: Positive for pain (left) and redness (left). Negative for discharge.  Neurological: Negative for weakness, numbness and headaches.    Physical Exam Updated Vital Signs BP 157/86 (BP Location: Right Arm)   Pulse 88   Temp 97.7 F (36.5 C) (Oral)   Resp 18   Ht 5\' 3"  (1.6 m)   Wt 160 lb (72.6 kg)   SpO2 96%   BMI 28.34 kg/m   Physical Exam  Constitutional: She  is oriented to person, place, and time. She appears well-developed and well-nourished. No distress.  HENT:  Head: Normocephalic and atraumatic.  Eyes: Conjunctivae and EOM are normal. Pupils are equal, round, and reactive to light.  Bilateral eye injected, left worse than right, pupils constricted. Equal, reactive to light and accommodation. Consensual photophobia in the left eye. No drainage. No foreign body bilaterally. Eyelid swept and flipped.  Neck: Neck supple. No tracheal deviation present.  Cardiovascular: Normal rate.   Pulmonary/Chest: Effort normal. No respiratory distress.  Musculoskeletal: Normal range of motion.  Neurological: She is alert and oriented to person, place, and time.    Skin: Skin is warm and dry.  Psychiatric: She has a normal mood and affect. Her behavior is normal.  Nursing note and vitals reviewed.   ED Treatments / Results  Labs (all labs ordered are listed, but only abnormal results are displayed) Labs Reviewed - No data to display  EKG  EKG Interpretation None       Radiology No results found.  Procedures Procedures (including critical care time)  DIAGNOSTIC STUDIES: Oxygen Saturation is 96% on RA, adequate by my interpretation.    COORDINATION OF CARE: 12:07 PM Discussed treatment plan with pt at bedside and pt agreed to plan.  Medications Ordered in ED Medications  atropine 1 % ophthalmic solution 2 drop (not administered)  gatifloxacin (ZYMAXID) 0.5 % ophthalmic drops 1 drop (not administered)  tetracaine (PONTOCAINE) 0.5 % ophthalmic solution 2 drop (2 drops Left Eye Given 01/25/16 1216)  fluorescein ophthalmic strip 1 strip (1 strip Left Eye Given 01/25/16 1216)  fluorescein ophthalmic strip 1 strip (1 strip Right Eye Given 01/25/16 1236)  oxyCODONE-acetaminophen (PERCOCET/ROXICET) 5-325 MG per tablet 1 tablet (1 tablet Oral Given 01/25/16 1259)     Initial Impression / Assessment and Plan / ED Course  I have reviewed the triage vital signs and the nursing notes.  Pertinent labs & imaging results that were available during my care of the patient were reviewed by me and considered in my medical decision making (see chart for details).  Clinical Course   Patient with bilateral eye irritation, left worse than right. Unable to open left eye. Tetracaine drops applied, continues to have severe pain despite tetracaine drops, however I was able to open it just enough to evaluated. It is injected, no drainage, pupils equal and reactive bilaterally. Fluorescein stain performed, showed no obvious corneal abrasion or ulceration. I discussed case with Dr. Karma GanjaLinker, advised to call ophthalmology. I discussed case with Dr. Allena KatzPatel with  ophthalmology who recommended atropine drops, Vigamox drops, and he will come by and see patient in ED. Unable to perform visual acuity, patient unable to open the eye.  Dr. Allena KatzPatel called back, asked if patient can be seen in the office at 3:30 today. I have given patient does of atropine drops and Zymaxid drops, we did not have Vigamox drops and the pharmacy, will discharge home with Norco for pain and follow-up in the office in an hour.  Vitals:   01/25/16 1032 01/25/16 1040 01/25/16 1213  BP: 157/86  157/78  Pulse: 88  69  Resp: 18  18  Temp: 97.7 F (36.5 C)  98.3 F (36.8 C)  TempSrc: Oral  Oral  SpO2: 96%  98%  Weight:  72.6 kg   Height:  5\' 3"  (1.6 m)     I personally performed the services described in this documentation, which was scribed in my presence. The recorded information has been reviewed and is  accurate.   Final Clinical Impressions(s) / ED Diagnoses   Final diagnoses:  Eye irritation    New Prescriptions New Prescriptions   HYDROCODONE-ACETAMINOPHEN (NORCO) 5-325 MG TABLET    Take 1 tablet by mouth every 6 (six) hours as needed for moderate pain.     Jaynie Crumble, PA-C 01/25/16 1413    Jerelyn Scott, MD 01/25/16 1440

## 2016-01-25 NOTE — ED Notes (Signed)
Patient is A+Ox4

## 2016-01-25 NOTE — ED Triage Notes (Signed)
Pt presents to ED with complaints of left eye pain/irritation. Pt states that she slept in her contact lenses last night and is now having severe pain. Left eye has significant swelling and redness.

## 2016-01-25 NOTE — ED Notes (Signed)
Patient stated that at this point she can not do a visual screening at this time her left eye hurts to bad to remove the towel.

## 2016-01-25 NOTE — ED Notes (Signed)
Patient stated she could not see the letters in visual acuity screening because she didn't have her glasses.  She refused to open her eyes and attempt.

## 2016-01-25 NOTE — Discharge Instructions (Signed)
Please report to Dr. Eliane DecreePatel's office today at 3:30. Apply zymaxid drops, 1 drop every hour until then. Bring both drops to the eye doctor.

## 2016-07-25 ENCOUNTER — Inpatient Hospital Stay (HOSPITAL_COMMUNITY): Payer: BLUE CROSS/BLUE SHIELD

## 2016-07-25 ENCOUNTER — Inpatient Hospital Stay (HOSPITAL_COMMUNITY)
Admission: EM | Admit: 2016-07-25 | Discharge: 2016-07-27 | DRG: 177 | Disposition: A | Discharge status: Home / Self Care | Payer: BLUE CROSS/BLUE SHIELD | Attending: Internal Medicine | Admitting: Internal Medicine

## 2016-07-25 ENCOUNTER — Emergency Department (HOSPITAL_COMMUNITY): Payer: BLUE CROSS/BLUE SHIELD

## 2016-07-25 ENCOUNTER — Encounter (HOSPITAL_COMMUNITY): Payer: Self-pay | Admitting: Emergency Medicine

## 2016-07-25 DIAGNOSIS — J690 Pneumonitis due to inhalation of food and vomit: Secondary | ICD-10-CM | POA: Diagnosis present

## 2016-07-25 DIAGNOSIS — N63 Unspecified lump in unspecified breast: Secondary | ICD-10-CM

## 2016-07-25 DIAGNOSIS — Z809 Family history of malignant neoplasm, unspecified: Secondary | ICD-10-CM

## 2016-07-25 DIAGNOSIS — J181 Lobar pneumonia, unspecified organism: Secondary | ICD-10-CM

## 2016-07-25 DIAGNOSIS — N6310 Unspecified lump in the right breast, unspecified quadrant: Secondary | ICD-10-CM | POA: Diagnosis present

## 2016-07-25 DIAGNOSIS — E860 Dehydration: Secondary | ICD-10-CM | POA: Diagnosis present

## 2016-07-25 DIAGNOSIS — J189 Pneumonia, unspecified organism: Secondary | ICD-10-CM | POA: Diagnosis not present

## 2016-07-25 DIAGNOSIS — T17908S Unspecified foreign body in respiratory tract, part unspecified causing other injury, sequela: Secondary | ICD-10-CM

## 2016-07-25 DIAGNOSIS — Z8249 Family history of ischemic heart disease and other diseases of the circulatory system: Secondary | ICD-10-CM | POA: Diagnosis not present

## 2016-07-25 DIAGNOSIS — F419 Anxiety disorder, unspecified: Secondary | ICD-10-CM | POA: Diagnosis present

## 2016-07-25 DIAGNOSIS — F329 Major depressive disorder, single episode, unspecified: Secondary | ICD-10-CM | POA: Diagnosis present

## 2016-07-25 DIAGNOSIS — F17210 Nicotine dependence, cigarettes, uncomplicated: Secondary | ICD-10-CM | POA: Diagnosis present

## 2016-07-25 DIAGNOSIS — J441 Chronic obstructive pulmonary disease with (acute) exacerbation: Secondary | ICD-10-CM | POA: Diagnosis present

## 2016-07-25 DIAGNOSIS — E871 Hypo-osmolality and hyponatremia: Secondary | ICD-10-CM | POA: Diagnosis present

## 2016-07-25 DIAGNOSIS — I10 Essential (primary) hypertension: Secondary | ICD-10-CM

## 2016-07-25 DIAGNOSIS — R222 Localized swelling, mass and lump, trunk: Secondary | ICD-10-CM | POA: Diagnosis not present

## 2016-07-25 DIAGNOSIS — Z8261 Family history of arthritis: Secondary | ICD-10-CM

## 2016-07-25 DIAGNOSIS — I110 Hypertensive heart disease with heart failure: Secondary | ICD-10-CM | POA: Diagnosis present

## 2016-07-25 DIAGNOSIS — J9801 Acute bronchospasm: Secondary | ICD-10-CM

## 2016-07-25 DIAGNOSIS — J9601 Acute respiratory failure with hypoxia: Secondary | ICD-10-CM | POA: Diagnosis present

## 2016-07-25 DIAGNOSIS — J440 Chronic obstructive pulmonary disease with acute lower respiratory infection: Secondary | ICD-10-CM | POA: Diagnosis present

## 2016-07-25 DIAGNOSIS — R0602 Shortness of breath: Secondary | ICD-10-CM | POA: Diagnosis not present

## 2016-07-25 DIAGNOSIS — T17528A Food in bronchus causing other injury, initial encounter: Secondary | ICD-10-CM | POA: Diagnosis present

## 2016-07-25 LAB — CBC
HEMATOCRIT: 39.4 % (ref 36.0–46.0)
Hemoglobin: 14.4 g/dL (ref 12.0–15.0)
MCH: 34.4 pg — ABNORMAL HIGH (ref 26.0–34.0)
MCHC: 36.5 g/dL — ABNORMAL HIGH (ref 30.0–36.0)
MCV: 94 fL (ref 78.0–100.0)
Platelets: 353 10*3/uL (ref 150–400)
RBC: 4.19 MIL/uL (ref 3.87–5.11)
RDW: 12.6 % (ref 11.5–15.5)
WBC: 16.7 10*3/uL — AB (ref 4.0–10.5)

## 2016-07-25 LAB — RESPIRATORY PANEL BY PCR
ADENOVIRUS-RVPPCR: NOT DETECTED
Bordetella pertussis: NOT DETECTED
CHLAMYDOPHILA PNEUMONIAE-RVPPCR: NOT DETECTED
CORONAVIRUS NL63-RVPPCR: NOT DETECTED
CORONAVIRUS OC43-RVPPCR: NOT DETECTED
Coronavirus 229E: NOT DETECTED
Coronavirus HKU1: NOT DETECTED
INFLUENZA A-RVPPCR: NOT DETECTED
Influenza B: NOT DETECTED
Metapneumovirus: NOT DETECTED
Mycoplasma pneumoniae: NOT DETECTED
PARAINFLUENZA VIRUS 1-RVPPCR: NOT DETECTED
PARAINFLUENZA VIRUS 4-RVPPCR: NOT DETECTED
Parainfluenza Virus 2: NOT DETECTED
Parainfluenza Virus 3: NOT DETECTED
RESPIRATORY SYNCYTIAL VIRUS-RVPPCR: NOT DETECTED
RHINOVIRUS / ENTEROVIRUS - RVPPCR: NOT DETECTED

## 2016-07-25 LAB — TROPONIN I: Troponin I: <0.03 ng/mL (ref <0.03)

## 2016-07-25 LAB — INFLUENZA PANEL BY PCR (TYPE A & B)
Influenza A By PCR: NEGATIVE
Influenza B By PCR: NEGATIVE

## 2016-07-25 LAB — COMPREHENSIVE METABOLIC PANEL
ALBUMIN: 4.1 g/dL (ref 3.5–5.0)
ALK PHOS: 78 U/L (ref 38–126)
ALT: 15 U/L (ref 14–54)
AST: 23 U/L (ref 15–41)
Anion gap: 9 (ref 5–15)
BILIRUBIN TOTAL: 1.9 mg/dL — AB (ref 0.3–1.2)
BUN: 8 mg/dL (ref 6–20)
CALCIUM: 9.3 mg/dL (ref 8.9–10.3)
CO2: 26 mmol/L (ref 22–32)
Chloride: 97 mmol/L — ABNORMAL LOW (ref 101–111)
Creatinine, Ser: 0.61 mg/dL (ref 0.44–1.00)
GFR calc Af Amer: >60 mL/min (ref >60)
GFR calc non Af Amer: >60 mL/min (ref >60)
GLUCOSE: 104 mg/dL — AB (ref 65–99)
Potassium: 3.9 mmol/L (ref 3.5–5.1)
Sodium: 132 mmol/L — ABNORMAL LOW (ref 135–145)
TOTAL PROTEIN: 7.7 g/dL (ref 6.5–8.1)

## 2016-07-25 LAB — MAGNESIUM: Magnesium: 1.9 mg/dL (ref 1.7–2.4)

## 2016-07-25 LAB — STREP PNEUMONIAE URINARY ANTIGEN: Strep Pneumo Urinary Antigen: NEGATIVE

## 2016-07-25 LAB — BRAIN NATRIURETIC PEPTIDE: B Natriuretic Peptide: 70.3 pg/mL (ref 0.0–100.0)

## 2016-07-25 MED ORDER — AZITHROMYCIN 250 MG PO TABS
500.0000 mg | ORAL_TABLET | Freq: Every day | ORAL | Status: DC
  Administered 2016-07-26 – 2016-07-27 (×2): 500 mg via ORAL
  Filled 2016-07-25 (×2): qty 2

## 2016-07-25 MED ORDER — LEVALBUTEROL HCL 0.63 MG/3ML IN NEBU
0.6300 mg | INHALATION_SOLUTION | Freq: Three times a day (TID) | RESPIRATORY_TRACT | Status: DC
  Administered 2016-07-25: 0.63 mg via RESPIRATORY_TRACT
  Filled 2016-07-25: qty 3

## 2016-07-25 MED ORDER — ONDANSETRON HCL 4 MG PO TABS: 4.0000 mg | ORAL_TABLET | Freq: Four times a day (QID) | ORAL | Status: DC | PRN

## 2016-07-25 MED ORDER — ONDANSETRON HCL 4 MG/2ML IJ SOLN: 4.0000 mg | Freq: Four times a day (QID) | INTRAMUSCULAR | Status: DC | PRN

## 2016-07-25 MED ORDER — SODIUM CHLORIDE 0.9 % IV SOLN
INTRAVENOUS | Status: DC
  Administered 2016-07-25 – 2016-07-27 (×4): via INTRAVENOUS

## 2016-07-25 MED ORDER — CYCLOBENZAPRINE HCL 5 MG PO TABS
5.0000 mg | ORAL_TABLET | Freq: Every evening | ORAL | Status: DC | PRN
  Administered 2016-07-25: 5 mg via ORAL
  Filled 2016-07-25: qty 1

## 2016-07-25 MED ORDER — METHYLPREDNISOLONE SODIUM SUCC 40 MG IJ SOLR
40.0000 mg | Freq: Four times a day (QID) | INTRAMUSCULAR | Status: DC
  Administered 2016-07-25 – 2016-07-26 (×3): 40 mg via INTRAVENOUS
  Filled 2016-07-25 (×3): qty 1

## 2016-07-25 MED ORDER — DEXTROSE 5 % IV SOLN
1.0000 g | INTRAVENOUS | Status: DC
  Administered 2016-07-26: 1 g via INTRAVENOUS
  Filled 2016-07-25 (×2): qty 10

## 2016-07-25 MED ORDER — CITALOPRAM HYDROBROMIDE 20 MG PO TABS
40.0000 mg | ORAL_TABLET | Freq: Every day | ORAL | Status: DC
  Administered 2016-07-25 – 2016-07-27 (×3): 40 mg via ORAL
  Filled 2016-07-25 (×4): qty 2

## 2016-07-25 MED ORDER — IPRATROPIUM BROMIDE 0.02 % IN SOLN
0.5000 mg | Freq: Once | RESPIRATORY_TRACT | Status: AC
  Administered 2016-07-25: 0.5 mg via RESPIRATORY_TRACT
  Filled 2016-07-25: qty 2.5

## 2016-07-25 MED ORDER — ACETAMINOPHEN 650 MG RE SUPP: 650.0000 mg | Freq: Four times a day (QID) | RECTAL | Status: DC | PRN

## 2016-07-25 MED ORDER — ALBUTEROL SULFATE (2.5 MG/3ML) 0.083% IN NEBU
5.0000 mg | INHALATION_SOLUTION | Freq: Once | RESPIRATORY_TRACT | Status: AC
  Administered 2016-07-25: 5 mg via RESPIRATORY_TRACT
  Filled 2016-07-25: qty 6

## 2016-07-25 MED ORDER — IOPAMIDOL (ISOVUE-370) INJECTION 76%
100.0000 mL | Freq: Once | INTRAVENOUS | Status: AC | PRN
  Administered 2016-07-25: 100 mL via INTRAVENOUS

## 2016-07-25 MED ORDER — ACETAMINOPHEN 325 MG PO TABS
650.0000 mg | ORAL_TABLET | Freq: Four times a day (QID) | ORAL | Status: DC | PRN
  Administered 2016-07-25 – 2016-07-26 (×3): 650 mg via ORAL
  Filled 2016-07-25 (×3): qty 2

## 2016-07-25 MED ORDER — ALBUTEROL SULFATE (2.5 MG/3ML) 0.083% IN NEBU: 5.0000 mg | INHALATION_SOLUTION | Freq: Once | RESPIRATORY_TRACT | Status: DC

## 2016-07-25 MED ORDER — METHYLPREDNISOLONE SODIUM SUCC 125 MG IJ SOLR
125.0000 mg | Freq: Once | INTRAMUSCULAR | Status: AC
  Administered 2016-07-25: 125 mg via INTRAVENOUS
  Filled 2016-07-25: qty 2

## 2016-07-25 MED ORDER — IOPAMIDOL (ISOVUE-370) INJECTION 76%
INTRAVENOUS | Status: AC
  Filled 2016-07-25: qty 100

## 2016-07-25 MED ORDER — DEXTROSE 5 % IV SOLN
1.0000 g | Freq: Once | INTRAVENOUS | Status: AC
  Administered 2016-07-25: 1 g via INTRAVENOUS
  Filled 2016-07-25: qty 10

## 2016-07-25 MED ORDER — DEXTROSE 5 % IV SOLN
500.0000 mg | Freq: Once | INTRAVENOUS | Status: AC
  Administered 2016-07-25: 500 mg via INTRAVENOUS
  Filled 2016-07-25: qty 500

## 2016-07-25 MED ORDER — LEVALBUTEROL HCL 1.25 MG/0.5ML IN NEBU
1.2500 mg | INHALATION_SOLUTION | Freq: Four times a day (QID) | RESPIRATORY_TRACT | Status: DC | PRN
  Administered 2016-07-25 – 2016-07-26 (×2): 1.25 mg via RESPIRATORY_TRACT
  Filled 2016-07-25 (×5): qty 0.5

## 2016-07-25 MED ORDER — ZOLPIDEM TARTRATE 5 MG PO TABS
5.0000 mg | ORAL_TABLET | Freq: Every evening | ORAL | Status: DC | PRN
  Administered 2016-07-25: 5 mg via ORAL
  Filled 2016-07-25: qty 1

## 2016-07-25 MED ORDER — SODIUM CHLORIDE 0.9 % IV BOLUS (SEPSIS)
500.0000 mL | Freq: Once | INTRAVENOUS | Status: AC
Start: 2016-07-25 — End: 2016-07-25
  Administered 2016-07-25: 500 mL via INTRAVENOUS

## 2016-07-25 MED ORDER — ENOXAPARIN SODIUM 40 MG/0.4ML ~~LOC~~ SOLN
40.0000 mg | SUBCUTANEOUS | Status: DC
  Administered 2016-07-25 – 2016-07-26 (×2): 40 mg via SUBCUTANEOUS
  Filled 2016-07-25 (×2): qty 0.4

## 2016-07-25 MED ORDER — SODIUM CHLORIDE 0.9 % IV BOLUS (SEPSIS)
1000.0000 mL | Freq: Once | INTRAVENOUS | Status: AC
  Administered 2016-07-25: 1000 mL via INTRAVENOUS

## 2016-07-25 MED ORDER — SODIUM CHLORIDE 0.9% FLUSH
3.0000 mL | Freq: Two times a day (BID) | INTRAVENOUS | Status: DC
  Administered 2016-07-25 – 2016-07-26 (×2): 3 mL via INTRAVENOUS

## 2016-07-25 NOTE — ED Notes (Signed)
Respiratory notified of bipap.

## 2016-07-25 NOTE — ED Notes (Signed)
2 sets of blood cultures collected after 1st antibiotic complete.

## 2016-07-25 NOTE — ED Notes (Signed)
ED Provider at bedside. 

## 2016-07-25 NOTE — Progress Notes (Signed)
Second call from RT to notify that MD has decided against BiPAP at this time.

## 2016-07-25 NOTE — Progress Notes (Signed)
MD made aware of CT results.

## 2016-07-25 NOTE — ED Notes (Signed)
X-ray at bedside

## 2016-07-25 NOTE — ED Provider Notes (Signed)
[facility]-EMERGENCY DEPT Provider Note   CSN: [id number] Arrival date & time: 07/25/16  [id number]     History   Chief Complaint Chief Complaint  Patient presents with  . Shortness of Breath    HPI [name] is a 51 y.o. female.  HPI Patient is a 51 year old female with a long history of tobacco abuse as well as a history of postpartum cardiomyopathy presents the emergency department increasing shortness of breath and upper back discomfort over the past several days.  She reports productive cough with occasional scant amounts of blood.  No frank hematemesis.  No use of anticoagulants.  She reports her breathing worsened today which is why she came to the ER for evaluation.  She continues to smoke cigarettes.  She is not on bronchodilators.  She has wheezing on examination.  She states she feels extreme short of breath and can only walk short distances.  O2 sats are found to be 88% on arrival.   Past Medical History:  Diagnosis Date  . Anemia   . Anxiety   . CHF (congestive heart failure) (HCC)   . Hypertension   . Postpartum cardiomyopathy 2003    There are no active problems to display for this patient.   Past Surgical History:  Procedure Laterality Date  . BUNIONECTOMY     bilateral  . CESAREAN SECTION      OB History    No data available       Home Medications    Prior to Admission medications   Medication Sig Start Date End Date Taking? Authorizing Provider  citalopram (CELEXA) 40 MG tablet Take 40 mg by mouth daily.   Yes Historical Provider, MD  lisinopril-hydrochlorothiazide (PRINZIDE,ZESTORETIC) 20-12.5 MG per tablet Take 1 tablet by mouth daily.   Yes Historical Provider, MD  Phenyleph-Doxylamine-DM-APAP (NYQUIL SEVERE COLD/FLU) 5-6.25-10-325 MG/15ML LIQD Take 15 mLs by mouth at bedtime as needed (cold/flu symptoms).   Yes Historical Provider, MD  cyclobenzaprine (FLEXERIL) 5 MG tablet Take 1 tablet (5 mg total) by mouth at bedtime as needed for muscle  spasms. Patient not taking: Reported on 07/25/2016 06/03/14   Thao P Le, DO  HYDROcodone-acetaminophen (NORCO) 5-325 MG tablet Take 1 tablet by mouth every 6 (six) hours as needed for moderate pain. Patient not taking: Reported on 07/25/2016 01/25/16   Jaynie Crumbleatyana Kirichenko, PA-C  meloxicam (MOBIC) 15 MG tablet Take 1 tablet (15 mg total) by mouth daily. No other NSAIDs, take with food Patient not taking: Reported on 07/25/2016 06/03/14   Lenell Antuhao P Le, DO    Family History Family History  Problem Relation Age of Onset  . Arthritis Mother   . Cancer Father   . Hypertension Father   . Heart disease Father     Social History Social History  Substance Use Topics  . Smoking status: Current Every Day Smoker    Packs/day: 1.00    Types: Cigarettes  . Smokeless tobacco: Never Used  . Alcohol use Yes     Allergies   Patient has no known allergies.   Review of Systems Review of Systems  All other systems reviewed and are negative.    Physical Exam Updated Vital Signs BP 118/79   Pulse 82   Temp 97.5 F (36.4 C) (Oral)   Resp 11   Wt 156 lb (70.8 kg)   SpO2 94%   BMI 27.63 kg/m   Physical Exam  Constitutional: She is oriented to person, place, and time. She appears well-developed and well-nourished. No distress.  HENT:  Head: Normocephalic and atraumatic.  Eyes: EOM are normal.  Neck: Normal range of motion.  Cardiovascular: Regular rhythm and normal heart sounds.   Tachycardia  Pulmonary/Chest:  Bilateral wheezing.  No significant rhonchi or rails.  Tachypnea.  Accessory muscle use.  Abdominal: Soft. She exhibits no distension. There is no tenderness.  Musculoskeletal: Normal range of motion.  Neurological: She is alert and oriented to person, place, and time.  Skin: Skin is warm and dry.  Psychiatric: She has a normal mood and affect. Judgment normal.  Nursing note and vitals reviewed.    ED Treatments / Results  Labs (all labs ordered are listed, but only abnormal  results are displayed) Labs Reviewed  CBC - Abnormal; Notable for the following:       Result Value   WBC 16.7 (*)    MCH 34.4 (*)    MCHC 36.5 (*)    All other components within normal limits  TROPONIN I  BRAIN NATRIURETIC PEPTIDE  COMPREHENSIVE METABOLIC PANEL    EKG  EKG Interpretation  Date/Time:  Monday July 25 2016 08:43:45 EST Ventricular Rate:  103 PR Interval:    QRS Duration: 100 QT Interval:  333 QTC Calculation: 438 R Axis:   58 Text Interpretation:  Sinus tachycardia Right atrial enlargement Low voltage, precordial leads Baseline wander in lead(s) II III aVF V2 V3 V4 V5 V6 ECG OTHERWISE WITHIN NORMAL LIMITS Confirmed by Patria Mane  MD, Caryn Bee ([id number]) on 07/25/2016 10:05:03 AM       Radiology Dg Chest Portable 1 View  Result Date: 07/25/2016 CLINICAL DATA:  Increasing shortness of breath and cough. EXAM: PORTABLE CHEST 1 VIEW COMPARISON:  01/31/2013 FINDINGS: The heart size and pulmonary vascularity are normal. There is increased density at the right lung base posterior medially. a linear areas of atelectasis at the right lung base. No effusions.  Bones are normal. IMPRESSION: Possible early infiltrate in the right lower lobe. Electronically Signed   By: Francene Boyers M.D.   On: 07/25/2016 09:24    Procedures Procedures (including critical care time)  Medications Ordered in ED Medications  cefTRIAXone (ROCEPHIN) 1 g in dextrose 5 % 50 mL IVPB (not administered)  azithromycin (ZITHROMAX) 500 mg in dextrose 5 % 250 mL IVPB (not administered)  albuterol (PROVENTIL) (2.5 MG/3ML) 0.083% nebulizer solution 5 mg (5 mg Nebulization Given 07/25/16 0902)  ipratropium (ATROVENT) nebulizer solution 0.5 mg (0.5 mg Nebulization Given 07/25/16 0904)     Initial Impression / Assessment and Plan / ED Course  I have reviewed the triage vital signs and the nursing notes.  Pertinent labs & imaging results that were available during my care of the patient were reviewed by me and  considered in my medical decision making (see chart for details).     Likely undiagnosed restrictive lung disease.  Improvement with bronchodilators.  Likely new pneumonia.  Patient given Rocephin and azithromycin.  Continues to require oxygen at this time.  Improving however.  Patient be admitted the hospital.  Suspect bronchospasm/COPD exacerbation in the setting of pneumonia.  Influenza testing will be performed  Final Clinical Impressions(s) / ED Diagnoses   Final diagnoses:  None    New Prescriptions New Prescriptions   No medications on file     Azalia Bilis, MD 07/25/16 1026

## 2016-07-25 NOTE — ED Notes (Signed)
Antibiotics ordered by EDP started prior to hospitalist ordered blood cultures. Hospitalist aware.

## 2016-07-25 NOTE — ED Triage Notes (Signed)
Patient c/o SOB and upper back pain for couple days. Patient has cough that is dry and sometimes blood.

## 2016-07-25 NOTE — H&P (Addendum)
[facility] History and Physical  [name] ZOX:[id number] DOB: 27-Jul-1965 DOA: 07/25/2016  Referring physician: *  PCP: No PCP Per Patient   Chief Complaint: Shortness of Breath  HPI:  51 year old female with a history of hypertension, postpartum cardiomyopathy, smoker, presents to the ER with worsening shortness of breath, tachycardia, dizziness, pleuritic chest pain for the last 48-72 hours. She has noticed streaks of blood, associated with mucus, and a productive cough. Last week she also had profuse nausea vomiting diarrhea for 2 days. She smokes one pack a day. She has had progressive dyspnea on exertion, not responding to bronchodilators at home. She has also noticed wheezing ED course Oxygen saturation 88 on arrival, initially was recommended to start BiPAP, patient refused, heart rate 118, blood pressure 1 14 /74. Chest x-ray shows possible early infiltrate in the right lower lobe. Patient admitted for community-acquired pneumonia/COPD exacerbation       Review of Systems: negative for the following  Constitutional: Denies fever, chills, diaphoresis, appetite change and positive for fatigue.  HEENT: Denies photophobia, eye pain, redness, hearing loss, ear pain, congestion, sore throat, rhinorrhea, sneezing, mouth sores, trouble swallowing, neck pain, neck stiffness and tinnitus.  Respiratory: positive for  SOB, DOE, cough, chest tightness, and wheezing.  Cardiovascular: Denies chest pain, palpitations and leg swelling.  Gastrointestinal: Denies nausea, vomiting, abdominal pain, diarrhea, constipation, blood in stool and abdominal distention.  Genitourinary: Denies dysuria, urgency, frequency, hematuria, flank pain and difficulty urinating.  Musculoskeletal: Denies myalgias, back pain, joint swelling, arthralgias and gait problem.  Skin: Denies pallor, rash and wound.  Neurological: Denies dizziness, seizures, syncope, weakness, light-headedness, numbness and headaches.   Hematological: Denies adenopathy. Easy bruising, personal or family bleeding history  Psychiatric/Behavioral: Denies suicidal ideation, mood changes, confusion, nervousness, sleep disturbance and agitation       Past Medical History:  Diagnosis Date  . Anemia   . Anxiety   . CHF (congestive heart failure) (HCC)   . Hypertension   . Postpartum cardiomyopathy 2003     Past Surgical History:  Procedure Laterality Date  . BUNIONECTOMY     bilateral  . CESAREAN SECTION        Social History:  reports that she has been smoking Cigarettes.  She has been smoking about 1.00 pack per day. She has never used smokeless tobacco. She reports that she drinks alcohol. She reports that she does not use drugs.    No Known Allergies  Family History  Problem Relation Age of Onset  . Arthritis Mother   . Cancer Father   . Hypertension Father   . Heart disease Father         Prior to Admission medications   Medication Sig Start Date End Date Taking? Authorizing Provider  citalopram (CELEXA) 40 MG tablet Take 40 mg by mouth daily.   Yes Historical Provider, MD  lisinopril-hydrochlorothiazide (PRINZIDE,ZESTORETIC) 20-12.5 MG per tablet Take 1 tablet by mouth daily.   Yes Historical Provider, MD  Phenyleph-Doxylamine-DM-APAP (NYQUIL SEVERE COLD/FLU) 5-6.25-10-325 MG/15ML LIQD Take 15 mLs by mouth at bedtime as needed (cold/flu symptoms).   Yes Historical Provider, MD  cyclobenzaprine (FLEXERIL) 5 MG tablet Take 1 tablet (5 mg total) by mouth at bedtime as needed for muscle spasms. Patient not taking: Reported on 07/25/2016 06/03/14   Thao P Le, DO  HYDROcodone-acetaminophen (NORCO) 5-325 MG tablet Take 1 tablet by mouth every 6 (six) hours as needed for moderate pain. Patient not taking: Reported on 07/25/2016 01/25/16   Jaynie Crumble, PA-C  meloxicam (MOBIC) 15 MG tablet Take 1 tablet (15 mg total) by mouth daily. No other NSAIDs, take with food Patient not taking: Reported on 07/25/2016  06/03/14   Lenell Antu, DO     Physical Exam: Vitals:   07/25/16 0913 07/25/16 1000 07/25/16 1015 07/25/16 1027  BP: 118/79 114/74    Pulse: 82 97 97 96  Resp: 11 20 17 16   Temp:      TempSrc:      SpO2: 94% 94% 92% 95%  Weight:          Constitutional: NAD, calm, comfortable Vitals:   07/25/16 0913 07/25/16 1000 07/25/16 1015 07/25/16 1027  BP: 118/79 114/74    Pulse: 82 97 97 96  Resp: 11 20 17 16   Temp:      TempSrc:      SpO2: 94% 94% 92% 95%  Weight:       Eyes: PERRL, lids and conjunctivae normal ENMT: Mucous membranes are moist. Posterior pharynx clear of any exudate or lesions.Normal dentition.  Neck: normal, supple, no masses, no thyromegaly Respiratory: Bilateral wheezing.  No significant rhonchi or rails.  Tachypnea.  Accessory muscle use. Cardiovascular: Regular rate and rhythm, no murmurs / rubs / gallops. No extremity edema. 2+ pedal pulses. No carotid bruits.  Abdomen: no tenderness, no masses palpated. No hepatosplenomegaly. Bowel sounds positive.  Musculoskeletal: no clubbing / cyanosis. No joint deformity upper and lower extremities. Good ROM, no contractures. Normal muscle tone.  Skin: no rashes, lesions, ulcers. No induration Neurologic: CN 2-12 grossly intact. Sensation intact, DTR normal. Strength 5/5 in all 4.  Psychiatric: Normal judgment and insight. Alert and oriented x 3. Normal mood.     Labs on Admission: I have personally reviewed following labs and imaging studies  CBC:  Recent Labs Lab 07/25/16 0908  WBC 16.7*  HGB 14.4  HCT 39.4  MCV 94.0  PLT 353    Basic Metabolic Panel:  Recent Labs Lab 07/25/16 0938  NA 132*  K 3.9  CL 97*  CO2 26  GLUCOSE 104*  BUN 8  CREATININE 0.61  CALCIUM 9.3    GFR: CrCl cannot be calculated (Unknown ideal weight.).  Liver Function Tests:  Recent Labs Lab 07/25/16 0938  AST 23  ALT 15  ALKPHOS 78  BILITOT 1.9*  PROT 7.7  ALBUMIN 4.1   No results for input(s): LIPASE, AMYLASE  in the last 168 hours. No results for input(s): AMMONIA in the last 168 hours.  Coagulation Profile: No results for input(s): INR, PROTIME in the last 168 hours. No results for input(s): DDIMER in the last 72 hours.  Cardiac Enzymes:  Recent Labs Lab 07/25/16 0908  TROPONINI <0.03    BNP (last 3 results) No results for input(s): PROBNP in the last 8760 hours.  HbA1C: No results for input(s): HGBA1C in the last 72 hours. No results found for: HGBA1C   CBG: No results for input(s): GLUCAP in the last 168 hours.  Lipid Profile: No results for input(s): CHOL, HDL, LDLCALC, TRIG, CHOLHDL, LDLDIRECT in the last 72 hours.  Thyroid Function Tests: No results for input(s): TSH, T4TOTAL, FREET4, T3FREE, THYROIDAB in the last 72 hours.  Anemia Panel: No results for input(s): VITAMINB12, FOLATE, FERRITIN, TIBC, IRON, RETICCTPCT in the last 72 hours.  Urine analysis: No results found for: COLORURINE, APPEARANCEUR, LABSPEC, PHURINE, GLUCOSEU, HGBUR, BILIRUBINUR, KETONESUR, PROTEINUR, UROBILINOGEN, NITRITE, LEUKOCYTESUR  Sepsis Labs: @LABRCNTIP (procalcitonin:4,lacticidven:4) )No results found for this or any previous visit (from the past 240 hour(s)).  Radiological Exams on Admission: Dg Chest Portable 1 View  Result Date: 07/25/2016 CLINICAL DATA:  Increasing shortness of breath and cough. EXAM: PORTABLE CHEST 1 VIEW COMPARISON:  01/31/2013 FINDINGS: The heart size and pulmonary vascularity are normal. There is increased density at the right lung base posterior medially. a linear areas of atelectasis at the right lung base. No effusions.  Bones are normal. IMPRESSION: Possible early infiltrate in the right lower lobe. Electronically Signed   By: Francene BoyersJames  Maxwell M.D.   On: 07/25/2016 09:24   Dg Chest Portable 1 View  Result Date: 07/25/2016 CLINICAL DATA:  Increasing shortness of breath and cough. EXAM: PORTABLE CHEST 1 VIEW COMPARISON:  01/31/2013 FINDINGS: The heart size and  pulmonary vascularity are normal. There is increased density at the right lung base posterior medially. a linear areas of atelectasis at the right lung base. No effusions.  Bones are normal. IMPRESSION: Possible early infiltrate in the right lower lobe. Electronically Signed   By: Francene BoyersJames  Maxwell M.D.   On: 07/25/2016 09:24      EKG: Independently reviewed.    Assessment/Plan Principal Problem:   CAP (community acquired pneumonia) Patient admitted to telemetry, due to hypoxia, sinus tachycardia Pneumonia focused orders set initiated Blood culture 2, sputum culture HIV antibody Urinary antigens Influenza PCR negative  Continue Rocephin/azithromycin  Sinus tachycardia Likely secondary to dehydration, recent gastrointestinal illness Should improve with IV hydration, CT chest negative for PE   Acute COPD exacerbation Continue IV Solu-Medrol, nebulizer treatments Sinus tachycardia likely secondary to albuterol, will use Xopenex CT PE protocol , negative for PE Endobronchial neoplasm cannot be excluded and recommend further characterization with bronchoscopy. Consider outpatient pulmonary evaluation      Essential hypertension Hold antihypertensive medications    History of  postpartum cardiomyopathy No recent 2-D echo, does not appear to be in CHF exacerbation  Hyponatremia Dehydration/HCTZ Hydrate with IV fluids and monitor  1.2 cm breast nodule Patient would need an outpatient updated mammography, to be arranged for by PCP     DVT prophylaxis: Lovenox     Code Status Orders Full code        consults called:  Family Communication: Admission, patients condition and plan of care including tests being ordered have been discussed with the patient  who indicates understanding and agree with the plan and Code Status  Admission status: inpatient   Disposition plan: Further plan will depend as patient's clinical course evolves and further radiologic and laboratory  data become available. Likely home when stable   At the time of admission, it appears that the appropriate admission status for this patient is INPATIENT .Thisis judged to be reasonable and necessary in order to provide the required intensity of service to ensure the patient's safetygiven thepresenting symptoms, physical exam findings, and initial radiographic and laboratory data in the context of their chronic comorbidities.   Richarda OverlieABROL,Lamont Glasscock MD [facility] Pager [phone]  If 7PM-7AM, please contact night-coverage [email or web address] Password [email or web address]  07/25/2016, 10:47 AM

## 2016-07-26 DIAGNOSIS — J690 Pneumonitis due to inhalation of food and vomit: Principal | ICD-10-CM

## 2016-07-26 DIAGNOSIS — E871 Hypo-osmolality and hyponatremia: Secondary | ICD-10-CM

## 2016-07-26 DIAGNOSIS — J181 Lobar pneumonia, unspecified organism: Secondary | ICD-10-CM

## 2016-07-26 DIAGNOSIS — R222 Localized swelling, mass and lump, trunk: Secondary | ICD-10-CM

## 2016-07-26 LAB — CBC
HCT: 34.4 % — ABNORMAL LOW (ref 36.0–46.0)
Hemoglobin: 12 g/dL (ref 12.0–15.0)
MCH: 32.9 pg (ref 26.0–34.0)
MCHC: 34.9 g/dL (ref 30.0–36.0)
MCV: 94.2 fL (ref 78.0–100.0)
PLATELETS: 345 10*3/uL (ref 150–400)
RBC: 3.65 MIL/uL — ABNORMAL LOW (ref 3.87–5.11)
RDW: 12.5 % (ref 11.5–15.5)
WBC: 22.1 10*3/uL — AB (ref 4.0–10.5)

## 2016-07-26 LAB — COMPREHENSIVE METABOLIC PANEL
ALT: 13 U/L — AB (ref 14–54)
AST: 17 U/L (ref 15–41)
Albumin: 3.4 g/dL — ABNORMAL LOW (ref 3.5–5.0)
Alkaline Phosphatase: 73 U/L (ref 38–126)
Anion gap: 8 (ref 5–15)
BILIRUBIN TOTAL: 0.6 mg/dL (ref 0.3–1.2)
BUN: 10 mg/dL (ref 6–20)
CHLORIDE: 105 mmol/L (ref 101–111)
CO2: 23 mmol/L (ref 22–32)
CREATININE: 0.46 mg/dL (ref 0.44–1.00)
Calcium: 9.3 mg/dL (ref 8.9–10.3)
Glucose, Bld: 155 mg/dL — ABNORMAL HIGH (ref 65–99)
Potassium: 3.2 mmol/L — ABNORMAL LOW (ref 3.5–5.1)
Sodium: 136 mmol/L (ref 135–145)
TOTAL PROTEIN: 6.8 g/dL (ref 6.5–8.1)

## 2016-07-26 LAB — HIV ANTIBODY (ROUTINE TESTING W REFLEX): HIV Screen 4th Generation wRfx: NONREACTIVE

## 2016-07-26 MED ORDER — ALBUTEROL SULFATE (2.5 MG/3ML) 0.083% IN NEBU
2.5000 mg | INHALATION_SOLUTION | Freq: Four times a day (QID) | RESPIRATORY_TRACT | Status: DC
  Administered 2016-07-26: 2.5 mg via RESPIRATORY_TRACT
  Filled 2016-07-26: qty 3

## 2016-07-26 MED ORDER — SODIUM CHLORIDE 3 % IN NEBU
4.0000 mL | INHALATION_SOLUTION | Freq: Two times a day (BID) | RESPIRATORY_TRACT | Status: DC
  Administered 2016-07-26 (×2): 4 mL via RESPIRATORY_TRACT
  Filled 2016-07-26 (×3): qty 4

## 2016-07-26 MED ORDER — IBUPROFEN 200 MG PO TABS
400.0000 mg | ORAL_TABLET | Freq: Four times a day (QID) | ORAL | Status: DC | PRN
  Administered 2016-07-26: 400 mg via ORAL
  Filled 2016-07-26: qty 2

## 2016-07-26 MED ORDER — PANTOPRAZOLE SODIUM 40 MG PO TBEC
40.0000 mg | DELAYED_RELEASE_TABLET | Freq: Every day | ORAL | Status: DC
  Administered 2016-07-26 – 2016-07-27 (×2): 40 mg via ORAL
  Filled 2016-07-26 (×2): qty 1

## 2016-07-26 MED ORDER — LORAZEPAM 0.5 MG PO TABS
0.5000 mg | ORAL_TABLET | Freq: Four times a day (QID) | ORAL | Status: DC | PRN
  Administered 2016-07-26 (×2): 0.5 mg via ORAL
  Filled 2016-07-26 (×2): qty 1

## 2016-07-26 MED ORDER — GUAIFENESIN 100 MG/5ML PO SOLN
5.0000 mL | ORAL | Status: DC | PRN
  Administered 2016-07-26 (×2): 100 mg via ORAL
  Filled 2016-07-26 (×2): qty 10

## 2016-07-26 MED ORDER — ALBUTEROL SULFATE (2.5 MG/3ML) 0.083% IN NEBU
2.5000 mg | INHALATION_SOLUTION | Freq: Three times a day (TID) | RESPIRATORY_TRACT | Status: DC
  Administered 2016-07-26 (×2): 2.5 mg via RESPIRATORY_TRACT
  Filled 2016-07-26 (×4): qty 3

## 2016-07-26 MED ORDER — PREDNISONE 20 MG PO TABS
40.0000 mg | ORAL_TABLET | Freq: Every day | ORAL | Status: DC
  Administered 2016-07-26 – 2016-07-27 (×2): 40 mg via ORAL
  Filled 2016-07-26 (×2): qty 2

## 2016-07-26 MED ORDER — ALBUTEROL SULFATE (2.5 MG/3ML) 0.083% IN NEBU
2.5000 mg | INHALATION_SOLUTION | Freq: Four times a day (QID) | RESPIRATORY_TRACT | Status: DC | PRN
Start: 2016-07-26 — End: 2016-07-27
  Administered 2016-07-26: 2.5 mg via RESPIRATORY_TRACT

## 2016-07-26 NOTE — Progress Notes (Signed)
Nurse called into room by patient.  She was c/o shortness of breath after walking back from the bathroom.  Patient appeared in acute respiratory distress.  Oxygen was applied.  MD called, breathing treatment given per verbal order Rapid response RN called.  Patient stabilized.  Will continue to monitor.

## 2016-07-26 NOTE — Consult Note (Signed)
Name: [name] MRN: [id number] DOB: February 18, 1966    ADMISSION DATE:  07/25/2016 CONSULTATION DATE:  07/26/16  REFERRING MD :  Dr. Aileen Fass   CHIEF COMPLAINT:  COPD with Acute Exacerbation, Possible Endobronchial Lesion    HISTORY OF PRESENT ILLNESS:  51 y/o F, current smoker (1-1.5 ppd x 35 years) admitted on 3/5 with several days of shortness of breath, dry cough and upper back pain.   The patient reports approximately 2 weeks prior to admit, she had a viral illness with 48 hours of nausea, vomiting and diarrhea. She reports she "never really felt better after the episode".  She recovered and made attempts at exercise outside - walking up to 2 miles.  States she felt terrible after with bone pain.  She reports frequent night sweats and 22lb weight loss in the last year.  She carries a prior history of anorexia and does not typically weigh herself.  As she felt better, she ordered fried rice for dinner one evening and had a choking episode on the rice and was never able to clear it.  She continued to have increased shortness of breath and developed upper back pain prompting ER visit.    Initial ER evaluation found her to be SOB, wheezing, tachycardic and saturations into the 80's on room air.  She reported she had been having a mostly dry cough but would occasionally get up scant amounts of blood.  At baseline, she is not on medications for COPD.  Labs - Na 132, K 3.9, sr cr 0.61, alk phos 78, BNP 70, WBC 22.1, Hgb 12 and platelets 345.  CXR was concerning for possible infiltrate in RLL vs atelectasis.  Given hypoxia and patient symptoms a CTA of the chest was completed which was negative for PE but demonstrated enlarged right hilar nodes, possible endobronchial lesion (2m), peribronchial thickening, extensive tree-in-bud formations in the RML, RLL, small amount of focal consolidation in the RLL, emphysema and an indeterminate 1.2cm right breast nodule.      PCCM consulted for evaluation.       PAST MEDICAL HISTORY :   has a past medical history of Anemia; Anxiety; CHF (congestive heart failure) ([place]; Hypertension; and Postpartum cardiomyopathy (2003).   has a past surgical history that includes Bunionectomy and Cesarean section.  Prior to Admission medications   Medication Sig Start Date End Date Taking? Authorizing Provider  citalopram (CELEXA) 40 MG tablet Take 40 mg by mouth daily.   Yes Historical Provider, MD  lisinopril-hydrochlorothiazide (PRINZIDE,ZESTORETIC) 20-12.5 MG per tablet Take 1 tablet by mouth daily.   Yes Historical Provider, MD  Phenyleph-Doxylamine-DM-APAP (NYQUIL SEVERE COLD/FLU) 5-6.25-10-325 MG/15ML LIQD Take 15 mLs by mouth at bedtime as needed (cold/flu symptoms).   Yes Historical Provider, MD  cyclobenzaprine (FLEXERIL) 5 MG tablet Take 1 tablet (5 mg total) by mouth at bedtime as needed for muscle spasms. Patient not taking: Reported on 07/25/2016 06/03/14   Thao P Le, DO  HYDROcodone-acetaminophen (NORCO) 5-325 MG tablet Take 1 tablet by mouth every 6 (six) hours as needed for moderate pain. Patient not taking: Reported on 07/25/2016 01/25/16   TJeannett Senior PA-C  meloxicam (MOBIC) 15 MG tablet Take 1 tablet (15 mg total) by mouth daily. No other NSAIDs, take with food Patient not taking: Reported on 07/25/2016 06/03/14   Thao P Le, DO    No Known Allergies  FAMILY HISTORY:  family history includes Arthritis in her mother; Cancer in her father; Heart disease in her father; Hypertension in  her father.  SOCIAL HISTORY:  reports that she has been smoking Cigarettes.  She has been smoking about 1.00 pack per day. She has never used smokeless tobacco. She reports that she drinks alcohol. She reports that she does not use drugs.  REVIEW OF SYSTEMS:  POSITIVES IN BOLD Constitutional: Negative for fever, chills, weight loss, malaise/fatigue and diaphoresis.  HENT: Negative for hearing loss, ear pain, nosebleeds, congestion, sore throat, neck pain,  tinnitus and ear discharge.   Eyes: Negative for blurred vision, double vision, photophobia, pain, discharge and redness.  Respiratory: Negative for cough, scant hemoptysis, sputum production, shortness of breath, wheezing and stridor.   Cardiovascular: Negative for chest pain, palpitations, orthopnea, claudication, leg swelling and PND.  Gastrointestinal: Negative for heartburn, nausea, vomiting, abdominal pain, diarrhea, constipation, blood in stool and melena.  Genitourinary: Negative for dysuria, urgency, frequency, hematuria and flank pain.  Musculoskeletal: Negative for myalgias, back pain, joint pain and falls.  Skin: Negative for itching and rash.  Neurological: Negative for dizziness, tingling, tremors, sensory change, speech change, focal weakness, seizures, loss of consciousness, weakness and headaches.  Endo/Heme/Allergies: Negative for environmental allergies and polydipsia. Does not bruise/bleed easily.  SUBJECTIVE:   VITAL SIGNS: Temp:  [98.1 F (36.7 C)-98.3 F (36.8 C)] 98.1 F (36.7 C) (03/06 0548) Pulse Rate:  [86-107] 86 (03/06 0548) Resp:  [15-20] 18 (03/06 0548) BP: (108-138)/(60-90) 108/90 (03/06 0548) SpO2:  [91 %-96 %] 94 % (03/06 0927)  PHYSICAL EXAMINATION: General:  Well developed adult female in NAD PSY: anxious Neuro:  AAOx4, speech clear, MAE  HEENT:  MM pink/moist, no jvd Cardiovascular:  s1s2 rrr, no m/r/g  Lungs:  Even/non-labored, lungs bilaterally coarse, no wheezing  Abdomen:  Soft, non-tender, bsx4 active  Musculoskeletal:  No acute deformities  Skin:  Warm/dry, no edema    Recent Labs Lab 07/25/16 0938 07/26/16 0717  NA 132* 136  K 3.9 3.2*  CL 97* 105  CO2 26 23  BUN 8 10  CREATININE 0.61 0.46  GLUCOSE 104* 155*     Recent Labs Lab 07/25/16 0908 07/26/16 0717  HGB 14.4 12.0  HCT 39.4 34.4*  WBC 16.7* 22.1*  PLT 353 345    Ct Angio Chest Pe W Or Wo Contrast  Result Date: 07/25/2016 CLINICAL DATA:  51 year old with  increasing shortness of breath and upper back discomfort. Smoker. History of postpartum cardiomyopathy. EXAM: CT ANGIOGRAPHY CHEST WITH CONTRAST TECHNIQUE: Multidetector CT imaging of the chest was performed using the standard protocol during bolus administration of intravenous contrast. Multiplanar CT image reconstructions and MIPs were obtained to evaluate the vascular anatomy. CONTRAST:  100 mL Isovue 370 COMPARISON:  Chest radiograph 07/25/2016 FINDINGS: Cardiovascular: No evidence for pulmonary embolism. Normal caliber of the thoracic aorta without dissection. Great vessels are patent. Proximal celiac trunk is patent. Normal aortic arch configuration. Mediastinum/Nodes: Enlarged right hilar lymph nodes measuring up to 1.4 cm in the short axis on sequence 4, image 47. Right subcarinal tissue measures 1.2 cm in the short axis on sequence 4, image 43. There is no significant left hilar lymphadenopathy. Right breast nodule measuring 1.2 cm on sequence 4, image 51 is nonspecific. No significant pericardial fluid. Lungs/Pleura: No pleural effusions. Right endobronchial filling defect in the bronchus intermedius on sequence 6 image 62. This structure appears to be hyperdense on the soft tissue windows on sequence 9, images 76 and cannot exclude an endobronchial enhancing lesion, measuring roughly 7 mm. Evidence for peribronchial thickening and endobronchial disease in the right lower lobe. Endobronchial  disease could be related to mucus plugging. There is extensive tree-in-bud formations in the right middle lobe and right lower lobe. Small amount of focal consolidation in the periphery of the posterior right lower lobe. Mild centrilobular emphysema in the upper lungs. Small amount of ground-glass disease in the anterior right upper lobe. Subtle density in the left upper lobe on sequence 6, image 26 is nonspecific. Overall, the left lung is relatively clear. Upper Abdomen: No acute abnormality in the upper abdomen.  Musculoskeletal: No acute bone abnormality. Review of the MIP images confirms the above findings. IMPRESSION: Extensive parenchymal disease in the right lung, predominantly in the right middle lobe and right lower lobe. The parenchymal disease is suggestive for an infectious or inflammatory process. However, this could represent postobstructive pneumonitis because there is a focal endobronchial lesion at the bronchus intermedius. Endobronchial neoplasm cannot be excluded and recommend further characterization with bronchoscopy. Right hilar and mediastinal lymphadenopathy. This adenopathy could be neoplastic or reactive depending on the etiology of the endobronchial lesion. Negative for a pulmonary embolism. Indeterminate 1.2 cm right breast nodule. Recommend further characterization with mammography and/or ultrasound. Normal caliber of the thoracic aorta without dissection. These results will be called to the ordering clinician or representative by the Radiologist Assistant, and communication documented in the PACS or zVision Dashboard. Electronically Signed   By: Markus Daft M.D.   On: 07/25/2016 15:23   Dg Chest Portable 1 View  Result Date: 07/25/2016 CLINICAL DATA:  Increasing shortness of breath and cough. EXAM: PORTABLE CHEST 1 VIEW COMPARISON:  01/31/2013 FINDINGS: The heart size and pulmonary vascularity are normal. There is increased density at the right lung base posterior medially. a linear areas of atelectasis at the right lung base. No effusions.  Bones are normal. IMPRESSION: Possible early infiltrate in the right lower lobe. Electronically Signed   By: Lorriane Shire M.D.   On: 07/25/2016 09:24      SIGNIFICANT EVENTS  3/05  Admit with cough, SOB > concern for PNA, AECOPD.  CTA neg for PE but raised ? of endobronchial lesion 3/06  PCCM consulted   STUDIES:  CTA Chest 3/6 >> negative for PE but demonstrated enlarged right hilar nodes, possible endobronchial lesion (90m), peribronchial  thickening, extensive tree-in-bud formations in the RML, RLL, small amount of focal consolidation in the RLL, emphysema and an indeterminate 1.2cm right breast nodule.         ASSESSMENT / PLAN:  Discussion:  51y/o F, current smoker, admitted with cough & SOB.  Work up concerning for possible PNA and COPD exacerbation.  CTA neg for PE but concerning for possible endobronchial lesion and hilar adenopathy.  Episode of aspiration of food prior to admit could explain CT findings.    Rule Out Endobronchial Lesion  Tree in Bud - likely post aspiration  - arrange for FOB for airway inspection / possible removal of foreign body vs biopsy of endobronchial lesion  - PCCM will arrange for NPO status etc once timing of procedure confirmed  COPD with Acute Exacerbation - not defined by PFT - will need outpatient PFT's after resolution of acute illness  - continue nebulized albuterol TID  - continue prednisone 462mQD with taper to off over 7-10 days  CAP - suspected aspiration event  - continue rocephin / azithromycin  - D2/x abx  Tobacco Abuse  - smoking cessation counseling initiated   Breast Nodule - incidental finding on CTA chest  - defer further evaluation to primary MD  PCCM  will follow along with you.  Thank you for the consultation.   Noe Gens, NP-C [facility] Pgr: [phone] or if no answer [phone] 07/26/2016, 10:44 AM

## 2016-07-26 NOTE — Progress Notes (Addendum)
[facility] PROGRESS NOTE    Progress Note  Daisy AnesSharon E Broerman  ZOX:[id number] DOB: 12/13/1965 DOA: 07/25/2016 PCP: Johny BlamerHARRIS, WILLIAM, MD     Brief Narrative:   [name] is an 51 y.o. female past medical history of postpartum cardio, she presented to the ED with shortness of breath tachycardia and dizziness for the last 48 hours.  Assessment/Plan:   CAP (community acquired pneumonia): Continue IV Rocephin and azithromycin, blood cultures are pending. She has remained afebrile saturations above 90% on 2 L.  Sinus tachycardia resolved with IV fluid hydration: CT scan chest negative. We'll consider pulmonary possible endobronchial biopsy a CT images show possible in the bronchial lesion.  Discontinue telemetry.  Acute COPD exacerbation: She was started on IV Solu-Medrol and nebulizer treatment. Change IV steroids to prednisone. Continue inhalers and antibiotics should be adequate. Essential hypertension Continue hold antihypertensive medication.  Hyponatremia: Likely due to prerenal etiology resolved with IV fluid hydration.  Anxiety and depression: Patient on Lexapro, range of motion about the knees with possible COPD and the endobronchial lesion seen on CT scan. Question if this is a mucous accumulation, we will have to consult pulmonary.  1.2 cm breast nodule Patient would need an outpatient updated mammography, to be arranged for by PCP  We have not talked to patient about the breast nodule, as the patient is very emotional and labile screaming and crying wide is happening to her.  DVT prophylaxis: lovenox Family Communication:none Disposition Plan/Barrier to D/C: home in 3 days Code Status:     Code Status Orders        Start     Ordered   07/25/16 1044  Full code  Continuous     07/25/16 1044    Code Status History    Date Active Date Inactive Code Status Order ID Comments User Context   This patient has a current code status but no historical code status.          IV Access:    Peripheral IV   Procedures and diagnostic studies:   Ct Angio Chest Pe W Or Wo Contrast  Result Date: 07/25/2016 CLINICAL DATA:  51 year old with increasing shortness of breath and upper back discomfort. Smoker. History of postpartum cardiomyopathy. EXAM: CT ANGIOGRAPHY CHEST WITH CONTRAST TECHNIQUE: Multidetector CT imaging of the chest was performed using the standard protocol during bolus administration of intravenous contrast. Multiplanar CT image reconstructions and MIPs were obtained to evaluate the vascular anatomy. CONTRAST:  100 mL Isovue 370 COMPARISON:  Chest radiograph 07/25/2016 FINDINGS: Cardiovascular: No evidence for pulmonary embolism. Normal caliber of the thoracic aorta without dissection. Great vessels are patent. Proximal celiac trunk is patent. Normal aortic arch configuration. Mediastinum/Nodes: Enlarged right hilar lymph nodes measuring up to 1.4 cm in the short axis on sequence 4, image 47. Right subcarinal tissue measures 1.2 cm in the short axis on sequence 4, image 43. There is no significant left hilar lymphadenopathy. Right breast nodule measuring 1.2 cm on sequence 4, image 51 is nonspecific. No significant pericardial fluid. Lungs/Pleura: No pleural effusions. Right endobronchial filling defect in the bronchus intermedius on sequence 6 image 62. This structure appears to be hyperdense on the soft tissue windows on sequence 9, images 76 and cannot exclude an endobronchial enhancing lesion, measuring roughly 7 mm. Evidence for peribronchial thickening and endobronchial disease in the right lower lobe. Endobronchial disease could be related to mucus plugging. There is extensive tree-in-bud formations in the right middle lobe and right lower lobe. Small amount of  focal consolidation in the periphery of the posterior right lower lobe. Mild centrilobular emphysema in the upper lungs. Small amount of ground-glass disease in the anterior right upper lobe.  Subtle density in the left upper lobe on sequence 6, image 26 is nonspecific. Overall, the left lung is relatively clear. Upper Abdomen: No acute abnormality in the upper abdomen. Musculoskeletal: No acute bone abnormality. Review of the MIP images confirms the above findings. IMPRESSION: Extensive parenchymal disease in the right lung, predominantly in the right middle lobe and right lower lobe. The parenchymal disease is suggestive for an infectious or inflammatory process. However, this could represent postobstructive pneumonitis because there is a focal endobronchial lesion at the bronchus intermedius. Endobronchial neoplasm cannot be excluded and recommend further characterization with bronchoscopy. Right hilar and mediastinal lymphadenopathy. This adenopathy could be neoplastic or reactive depending on the etiology of the endobronchial lesion. Negative for a pulmonary embolism. Indeterminate 1.2 cm right breast nodule. Recommend further characterization with mammography and/or ultrasound. Normal caliber of the thoracic aorta without dissection. These results will be called to the ordering clinician or representative by the Radiologist Assistant, and communication documented in the PACS or zVision Dashboard. Electronically Signed   By: Richarda Overlie M.D.   On: 07/25/2016 15:23   Dg Chest Portable 1 View  Result Date: 07/25/2016 CLINICAL DATA:  Increasing shortness of breath and cough. EXAM: PORTABLE CHEST 1 VIEW COMPARISON:  01/31/2013 FINDINGS: The heart size and pulmonary vascularity are normal. There is increased density at the right lung base posterior medially. a linear areas of atelectasis at the right lung base. No effusions.  Bones are normal. IMPRESSION: Possible early infiltrate in the right lower lobe. Electronically Signed   By: Francene Boyers M.D.   On: 07/25/2016 09:24     Medical Consultants:    None.  Anti-Infectives:   Rocephin and azithro  Subjective:    [name] Patient  is crying very emotional about the news. She has some shortness of breath is improved.  Objective:    Vitals:   07/25/16 1800 07/25/16 2014 07/25/16 2109 07/26/16 0548  BP:   138/73 108/90  Pulse:   (!) 107 86  Resp:   17 18  Temp:   98.2 F (36.8 C) 98.1 F (36.7 C)  TempSrc:   Oral Oral  SpO2:  94% 92% 91%  Weight:      Height: 5\' 6"  (1.676 m)       Intake/Output Summary (Last 24 hours) at 07/26/16 0750 Last data filed at 07/26/16 0300  Gross per 24 hour  Intake           1302.5 ml  Output                0 ml  Net           1302.5 ml   Filed Weights   07/25/16 0841  Weight: 70.8 kg (156 lb)    Exam: General exam: In no acute distress. Respiratory system: Good air movement and clear to auscultation , Wheezing.  Cardiovascular system: S1 & S2 heard, RRR. Gastrointestinal system: Abdomen is nondistended, soft and nontender.  Extremities: No pedal edema. Skin: No rashes, lesions or ulcers Psychiatry: Judgement and insight appear normal. Mood & affect appropriate.    Data Reviewed:    Labs: Basic Metabolic Panel:  Recent Labs Lab 07/25/16 0938 07/25/16 1112  NA 132*  --   K 3.9  --   CL 97*  --   CO2 26  --  GLUCOSE 104*  --   BUN 8  --   CREATININE 0.61  --   CALCIUM 9.3  --   MG  --  1.9   GFR Estimated Creatinine Clearance: 77.9 mL/min (by C-G formula based on SCr of 0.61 mg/dL). Liver Function Tests:  Recent Labs Lab 07/25/16 0938  AST 23  ALT 15  ALKPHOS 78  BILITOT 1.9*  PROT 7.7  ALBUMIN 4.1   No results for input(s): LIPASE, AMYLASE in the last 168 hours. No results for input(s): AMMONIA in the last 168 hours. Coagulation profile No results for input(s): INR, PROTIME in the last 168 hours.  CBC:  Recent Labs Lab 07/25/16 0908  WBC 16.7*  HGB 14.4  HCT 39.4  MCV 94.0  PLT 353   Cardiac Enzymes:  Recent Labs Lab 07/25/16 0908  TROPONINI <0.03   BNP (last 3 results) No results for input(s): PROBNP in the last 8760  hours. CBG: No results for input(s): GLUCAP in the last 168 hours. D-Dimer: No results for input(s): DDIMER in the last 72 hours. Hgb A1c: No results for input(s): HGBA1C in the last 72 hours. Lipid Profile: No results for input(s): CHOL, HDL, LDLCALC, TRIG, CHOLHDL, LDLDIRECT in the last 72 hours. Thyroid function studies: No results for input(s): TSH, T4TOTAL, T3FREE, THYROIDAB in the last 72 hours.  Invalid input(s): FREET3 Anemia work up: No results for input(s): VITAMINB12, FOLATE, FERRITIN, TIBC, IRON, RETICCTPCT in the last 72 hours. Sepsis Labs:  Recent Labs Lab 07/25/16 0908  WBC 16.7*   Microbiology Recent Results (from the past 240 hour(s))  Respiratory Panel by PCR     Status: None   Collection Time: 07/25/16 12:45 PM  Result Value Ref Range Status   Adenovirus NOT DETECTED NOT DETECTED Final   Coronavirus 229E NOT DETECTED NOT DETECTED Final   Coronavirus HKU1 NOT DETECTED NOT DETECTED Final   Coronavirus NL63 NOT DETECTED NOT DETECTED Final   Coronavirus OC43 NOT DETECTED NOT DETECTED Final   Metapneumovirus NOT DETECTED NOT DETECTED Final   Rhinovirus / Enterovirus NOT DETECTED NOT DETECTED Final   Influenza A NOT DETECTED NOT DETECTED Final   Influenza B NOT DETECTED NOT DETECTED Final   Parainfluenza Virus 1 NOT DETECTED NOT DETECTED Final   Parainfluenza Virus 2 NOT DETECTED NOT DETECTED Final   Parainfluenza Virus 3 NOT DETECTED NOT DETECTED Final   Parainfluenza Virus 4 NOT DETECTED NOT DETECTED Final   Respiratory Syncytial Virus NOT DETECTED NOT DETECTED Final   Bordetella pertussis NOT DETECTED NOT DETECTED Final   Chlamydophila pneumoniae NOT DETECTED NOT DETECTED Final   Mycoplasma pneumoniae NOT DETECTED NOT DETECTED Final    Comment: Performed at [facility], [address] [place], [place] [address]     Medications:   . azithromycin  500 mg Oral Daily  . cefTRIAXone (ROCEPHIN)  IV  1 g Intravenous Q24H  . citalopram  40 mg Oral  Daily  . enoxaparin (LOVENOX) injection  40 mg Subcutaneous Q24H  . levalbuterol  0.63 mg Nebulization TID  . methylPREDNISolone (SOLU-MEDROL) injection  40 mg Intravenous Q6H  . sodium chloride flush  3 mL Intravenous Q12H   Continuous Infusions: . sodium chloride 75 mL/hr at 07/25/16 2103    Time spent: 25 min   LOS: 1 day   Marinda Elk  [facility] Pager [phone]  *Please refer to [email or web address], password [email or web address] to get updated schedule on who will round on this patient, as hospitalists switch teams weekly. If  7PM-7AM, please contact night-coverage at [email or web address], password [email or web address] for any overnight needs.  07/26/2016, 7:50 AM

## 2016-07-27 ENCOUNTER — Ambulatory Visit (HOSPITAL_COMMUNITY)
Admission: RE | Admit: 2016-07-27 | Payer: BLUE CROSS/BLUE SHIELD | Source: Ambulatory Visit | Admitting: Pulmonary Disease

## 2016-07-27 ENCOUNTER — Encounter (HOSPITAL_COMMUNITY)
Admission: EM | Disposition: A | Discharge status: Home / Self Care | Payer: Self-pay | Source: Home / Self Care | Attending: Internal Medicine

## 2016-07-27 ENCOUNTER — Inpatient Hospital Stay (HOSPITAL_COMMUNITY): Payer: BLUE CROSS/BLUE SHIELD

## 2016-07-27 ENCOUNTER — Encounter (HOSPITAL_COMMUNITY): Payer: Self-pay | Admitting: Respiratory Therapy

## 2016-07-27 DIAGNOSIS — J189 Pneumonia, unspecified organism: Secondary | ICD-10-CM

## 2016-07-27 DIAGNOSIS — T17908S Unspecified foreign body in respiratory tract, part unspecified causing other injury, sequela: Secondary | ICD-10-CM

## 2016-07-27 DIAGNOSIS — N63 Unspecified lump in unspecified breast: Secondary | ICD-10-CM

## 2016-07-27 HISTORY — PX: VIDEO BRONCHOSCOPY: SHX5072

## 2016-07-27 SURGERY — VIDEO BRONCHOSCOPY WITHOUT FLUORO
Anesthesia: Moderate Sedation | Laterality: Bilateral

## 2016-07-27 MED ORDER — LIDOCAINE HCL 2 % EX GEL
1.0000 "application " | Freq: Once | CUTANEOUS | Status: DC
  Filled 2016-07-27: qty 5

## 2016-07-27 MED ORDER — AMOXICILLIN-POT CLAVULANATE 875-125 MG PO TABS: 1.0000 | ORAL_TABLET | Freq: Two times a day (BID) | ORAL | 0 refills | Status: AC

## 2016-07-27 MED ORDER — PHENYLEPHRINE HCL 0.25 % NA SOLN
1.0000 | Freq: Four times a day (QID) | NASAL | Status: DC | PRN
  Filled 2016-07-27: qty 15

## 2016-07-27 MED ORDER — MIDAZOLAM HCL 10 MG/2ML IJ SOLN
INTRAMUSCULAR | Status: DC | PRN
  Administered 2016-07-27 (×2): 2 mg via INTRAVENOUS
  Administered 2016-07-27 (×2): 1 mg via INTRAVENOUS

## 2016-07-27 MED ORDER — LIDOCAINE HCL 2 % EX GEL: 1.0000 "application " | Freq: Once | CUTANEOUS | Status: DC

## 2016-07-27 MED ORDER — FENTANYL CITRATE (PF) 100 MCG/2ML IJ SOLN
INTRAMUSCULAR | Status: DC | PRN
  Administered 2016-07-27 (×2): 50 ug via INTRAVENOUS

## 2016-07-27 MED ORDER — MIDAZOLAM HCL 5 MG/ML IJ SOLN
INTRAMUSCULAR | Status: AC
  Filled 2016-07-27: qty 2

## 2016-07-27 MED ORDER — PHENYLEPHRINE HCL 0.25 % NA SOLN: 1.0000 | Freq: Four times a day (QID) | NASAL | Status: DC | PRN

## 2016-07-27 MED ORDER — PHENYLEPHRINE HCL 0.25 % NA SOLN
NASAL | Status: DC | PRN
  Administered 2016-07-27: 2 via NASAL

## 2016-07-27 MED ORDER — LIDOCAINE HCL 2 % EX GEL
CUTANEOUS | Status: DC | PRN
  Administered 2016-07-27: 1

## 2016-07-27 MED ORDER — SODIUM CHLORIDE 0.9 % IV SOLN
INTRAVENOUS | Status: DC
  Administered 2016-07-27: 08:00:00 via INTRAVENOUS

## 2016-07-27 MED ORDER — FENTANYL CITRATE (PF) 100 MCG/2ML IJ SOLN
INTRAMUSCULAR | Status: AC
  Filled 2016-07-27: qty 4

## 2016-07-27 MED ORDER — POTASSIUM CHLORIDE CRYS ER 20 MEQ PO TBCR
40.0000 meq | EXTENDED_RELEASE_TABLET | Freq: Once | ORAL | Status: AC
  Administered 2016-07-27: 40 meq via ORAL
  Filled 2016-07-27: qty 2

## 2016-07-27 MED ORDER — ALBUTEROL SULFATE HFA 108 (90 BASE) MCG/ACT IN AERS: 2.0000 | INHALATION_SPRAY | Freq: Four times a day (QID) | RESPIRATORY_TRACT | 0 refills | Status: AC | PRN

## 2016-07-27 MED ORDER — BUTAMBEN-TETRACAINE-BENZOCAINE 2-2-14 % EX AERO: 1.0000 | INHALATION_SPRAY | Freq: Once | CUTANEOUS | Status: DC

## 2016-07-27 MED ORDER — BUTAMBEN-TETRACAINE-BENZOCAINE 2-2-14 % EX AERO
1.0000 | INHALATION_SPRAY | Freq: Once | CUTANEOUS | Status: DC
  Filled 2016-07-27: qty 20

## 2016-07-27 MED ORDER — LIDOCAINE HCL 1 % IJ SOLN
INTRAMUSCULAR | Status: DC | PRN
  Administered 2016-07-27: 6 mL via RESPIRATORY_TRACT

## 2016-07-27 NOTE — Progress Notes (Signed)
Pt discharged home with spouse in stable condition. Discharge instructions and scripts given. Pt verbalized understanding. No immediate questions or concerns at this time.  

## 2016-07-27 NOTE — Discharge Summary (Addendum)
Physician Discharge Summary  [name] ZOX:[id number] DOB: 10-30-1965 DOA: 07/25/2016  PCP: Johny Blamer, MD  Admit date: 07/25/2016 Discharge date: 07/27/2016  Admitted From: home  Disposition:  home  Recommendations for Outpatient Follow-up:  1. Follow up with PCP 2 weeks:  Referral for diagnostic mammogram and possible right breast US, counseling regarding smoking cessation 2. Please obtain BMP/CBC at next visit to confirm resolution of leukocytosis and check potassium 3. Augmentin for aspiration pneumonia 4. Follow up:  Pending blood cultures  Home Health:  none  Equipment/Devices:  none  Discharge Condition:  Stable, improved CODE STATUS:  full  Diet recommendation:  Regular   Brief/Interim Summary:  The patient is a 51 year old female with history of hypertension, postpartum cardiomyopathy, smoking, who presented to the emergency department with progressive dyspnea, tachycardia, dizziness, and pleuritic chest pain for the last 2-3 days prior to admission. Her mucus contained streaks of blood. She did not improve with bronchodilators and presented to the emergency department. She was 88% on room air and was started on BiPAP in the emergency department. Her chest x-ray demonstrated a right lower lobe pneumonia. She was started on steroids and nebulizer treatments but did not have much improvement. She underwent a CT scan of the chest which was concerning for a right lower lobe endobronchial lesion in the same distribution as her pneumonia. Pulmonology was consulted and she underwent bronchoscopy on 07/27/2016. She was found to have an aspirated pea in the bronchus intermedius.  Once her pea was removed during bronchoscopy, she was able to be placed on room air without tachypnea or dyspnea, her chest pain improved and she felt back to baseline.  She should continue antibiotics for aspiration/post-obstructive pneumonia and follow up in 2 weeks with her PCP to verify that her leukocytosis has  resolved and her electrolytes are normal.    Discharge Diagnoses:  Principal Problem:   Post-obstructive pneumonia due to foreign body aspiration Active Problems:   Essential hypertension   Hyponatremia   Breast nodule, RIGHT  Post-obstructive pneumonia due to foreign body aspiration s/p bronchoscopic removal of pea on 07/27/2016 -  Initially started on treatment for CAP with ceftriaxone and azithromycin -  Transitioned to augmentin once etiology of pneumonia discovered during bronchoscopy -  Blood cultures so far no growth -  Continue antibiotics for a total of 7 days (5 days of augmentin)  Sinus tachycardia resolved with IV fluids  Acute COPD exacerbation -  Started on IV Solu-Medrol and nebulizer treatment -  D/c steroids post-bronchoscopy since symptoms resolved with removal of foreign body  Essential hypertension, continue antihypertensive medication.  Hyponatremia resolved with IVF  Anxiety and depression, anxious during hospitalization, continued lexapro  1.2 cm breast nodule, RIGHT.  Discussed findings with patient and advised her to get a referral for mammogram from her PCP.  She may also need ultrasound and biopsy but will defer to PCP.     Discharge Instructions  Discharge Instructions    Call MD for:  difficulty breathing, headache or visual disturbances    Complete by:  As directed    Call MD for:  extreme fatigue    Complete by:  As directed    Call MD for:  hives    Complete by:  As directed    Call MD for:  persistant dizziness or light-headedness    Complete by:  As directed    Call MD for:  persistant nausea and vomiting    Complete by:  As directed    Call  MD for:  severe uncontrolled pain    Complete by:  As directed    Call MD for:  temperature >100.4    Complete by:  As directed    Diet - low sodium heart healthy    Complete by:  As directed    Discharge instructions    Complete by:  As directed    Please see your primary care doctor in 2  weeks for repeat blood work, discussion of any wheezing/use of albuterol, and referral for mammogram focusing on the right breast.   Increase activity slowly    Complete by:  As directed        Medication List    STOP taking these medications   cyclobenzaprine 5 MG tablet Commonly known as:  FLEXERIL   HYDROcodone-acetaminophen 5-325 MG tablet Commonly known as:  NORCO   meloxicam 15 MG tablet Commonly known as:  MOBIC   NYQUIL SEVERE COLD/FLU 5-6.25-10-325 MG/15ML Liqd Generic drug:  Phenyleph-Doxylamine-DM-APAP     TAKE these medications   albuterol 108 (90 Base) MCG/ACT inhaler Commonly known as:  PROVENTIL HFA;VENTOLIN HFA Inhale 2 puffs into the lungs every 6 (six) hours as needed for wheezing or shortness of breath.   amoxicillin-clavulanate 875-125 MG tablet Commonly known as:  AUGMENTIN Take 1 tablet by mouth 2 (two) times daily.   citalopram 40 MG tablet Commonly known as:  CELEXA Take 40 mg by mouth daily.   lisinopril-hydrochlorothiazide 20-12.5 MG tablet Commonly known as:  PRINZIDE,ZESTORETIC Take 1 tablet by mouth daily.       No Known Allergies  Consultations: Pulmonology  Procedures/Studies: Ct Angio Chest Pe W Or Wo Contrast  Result Date: 07/25/2016 CLINICAL DATA:  51 year old with increasing shortness of breath and upper back discomfort. Smoker. History of postpartum cardiomyopathy. EXAM: CT ANGIOGRAPHY CHEST WITH CONTRAST TECHNIQUE: Multidetector CT imaging of the chest was performed using the standard protocol during bolus administration of intravenous contrast. Multiplanar CT image reconstructions and MIPs were obtained to evaluate the vascular anatomy. CONTRAST:  100 mL Isovue 370 COMPARISON:  Chest radiograph 07/25/2016 FINDINGS: Cardiovascular: No evidence for pulmonary embolism. Normal caliber of the thoracic aorta without dissection. Great vessels are patent. Proximal celiac trunk is patent. Normal aortic arch configuration.  Mediastinum/Nodes: Enlarged right hilar lymph nodes measuring up to 1.4 cm in the Vannessa Godown axis on sequence 4, image 47. Right subcarinal tissue measures 1.2 cm in the Damiean Lukes axis on sequence 4, image 43. There is no significant left hilar lymphadenopathy. Right breast nodule measuring 1.2 cm on sequence 4, image 51 is nonspecific. No significant pericardial fluid. Lungs/Pleura: No pleural effusions. Right endobronchial filling defect in the bronchus intermedius on sequence 6 image 62. This structure appears to be hyperdense on the soft tissue windows on sequence 9, images 76 and cannot exclude an endobronchial enhancing lesion, measuring roughly 7 mm. Evidence for peribronchial thickening and endobronchial disease in the right lower lobe. Endobronchial disease could be related to mucus plugging. There is extensive tree-in-bud formations in the right middle lobe and right lower lobe. Small amount of focal consolidation in the periphery of the posterior right lower lobe. Mild centrilobular emphysema in the upper lungs. Small amount of ground-glass disease in the anterior right upper lobe. Subtle density in the left upper lobe on sequence 6, image 26 is nonspecific. Overall, the left lung is relatively clear. Upper Abdomen: No acute abnormality in the upper abdomen. Musculoskeletal: No acute bone abnormality. Review of the MIP images confirms the above findings. IMPRESSION: Extensive parenchymal disease  in the right lung, predominantly in the right middle lobe and right lower lobe. The parenchymal disease is suggestive for an infectious or inflammatory process. However, this could represent postobstructive pneumonitis because there is a focal endobronchial lesion at the bronchus intermedius. Endobronchial neoplasm cannot be excluded and recommend further characterization with bronchoscopy. Right hilar and mediastinal lymphadenopathy. This adenopathy could be neoplastic or reactive depending on the etiology of the  endobronchial lesion. Negative for a pulmonary embolism. Indeterminate 1.2 cm right breast nodule. Recommend further characterization with mammography and/or ultrasound. Normal caliber of the thoracic aorta without dissection. These results will be called to the ordering clinician or representative by the Radiologist Assistant, and communication documented in the PACS or zVision Dashboard. Electronically Signed   By: Richarda OverlieAdam  Henn M.D.   On: 07/25/2016 15:23   Dg Chest Portable 1 View  Result Date: 07/25/2016 CLINICAL DATA:  Increasing shortness of breath and cough. EXAM: PORTABLE CHEST 1 VIEW COMPARISON:  01/31/2013 FINDINGS: The heart size and pulmonary vascularity are normal. There is increased density at the right lung base posterior medially. a linear areas of atelectasis at the right lung base. No effusions.  Bones are normal. IMPRESSION: Possible early infiltrate in the right lower lobe. Electronically Signed   By: Francene BoyersJames  Maxwell M.D.   On: 07/25/2016 09:24   Subjective: Feeling back to normal today.  Breathing back to normal.  Denies shortness of breath even with ambulation.  Has not smoked in 3 days.    Discharge Exam: Vitals:   07/27/16 0930 07/27/16 0935  BP: 98/72 118/66  Pulse:    Resp: 15 (!) 21  Temp:     Vitals:   07/27/16 0920 07/27/16 0925 07/27/16 0930 07/27/16 0935  BP: 111/67 114/89 98/72 118/66  Pulse:      Resp: 18 13 15  (!) 21  Temp:      TempSrc:      SpO2: 95% 94% 92% 92%  Weight:      Height:        General: Pt is alert, awake, not in acute distress, pleasant, normal affect Cardiovascular: RRR, S1/S2 +, no rubs, no gallops Respiratory: CTA bilaterally, no wheezing, no rhonchi Abdominal: Soft, NT, ND, bowel sounds + Extremities: no edema, no cyanosis    The results of significant diagnostics from this hospitalization (including imaging, microbiology, ancillary and laboratory) are listed below for reference.     Microbiology: Recent Results (from the past  240 hour(s))  Culture, blood (routine x 2)     Status: None (Preliminary result)   Collection Time: 07/25/16 11:12 AM  Result Value Ref Range Status   Specimen Description BLOOD LEFT ANTECUBITAL  Final   Special Requests BOTTLES DRAWN AEROBIC AND ANAEROBIC 5 CC EA  Final   Culture   Final    NO GROWTH 2 DAYS Performed at [facility], [address] [place], [place] [address]    Report Status PENDING  Incomplete  Culture, blood (routine x 2)     Status: None (Preliminary result)   Collection Time: 07/25/16 11:12 AM  Result Value Ref Range Status   Specimen Description BLOOD BLOOD RIGHT FOREARM  Final   Special Requests BOTTLES DRAWN AEROBIC AND ANAEROBIC 5 CC EA  Final   Culture   Final    NO GROWTH 2 DAYS Performed at [facility], [address] [place], [place] [address]    Report Status PENDING  Incomplete  Respiratory Panel by PCR     Status:  None   Collection Time: 07/25/16 12:45 PM  Result Value Ref Range Status   Adenovirus NOT DETECTED NOT DETECTED Final   Coronavirus 229E NOT DETECTED NOT DETECTED Final   Coronavirus HKU1 NOT DETECTED NOT DETECTED Final   Coronavirus NL63 NOT DETECTED NOT DETECTED Final   Coronavirus OC43 NOT DETECTED NOT DETECTED Final   Metapneumovirus NOT DETECTED NOT DETECTED Final   Rhinovirus / Enterovirus NOT DETECTED NOT DETECTED Final   Influenza A NOT DETECTED NOT DETECTED Final   Influenza B NOT DETECTED NOT DETECTED Final   Parainfluenza Virus 1 NOT DETECTED NOT DETECTED Final   Parainfluenza Virus 2 NOT DETECTED NOT DETECTED Final   Parainfluenza Virus 3 NOT DETECTED NOT DETECTED Final   Parainfluenza Virus 4 NOT DETECTED NOT DETECTED Final   Respiratory Syncytial Virus NOT DETECTED NOT DETECTED Final   Bordetella pertussis NOT DETECTED NOT DETECTED Final   Chlamydophila pneumoniae NOT DETECTED NOT DETECTED Final   Mycoplasma pneumoniae NOT DETECTED NOT DETECTED Final    Comment: Performed at [facility], [address] [place], [place] [address]     Labs: BNP (last 3 results)  Recent Labs  07/25/16 0908  BNP 70.3   Basic Metabolic Panel:  Recent Labs Lab 07/25/16 0938 07/25/16 1112 07/26/16 0717  NA 132*  --  136  K 3.9  --  3.2*  CL 97*  --  105  CO2 26  --  23  GLUCOSE 104*  --  155*  BUN 8  --  10  CREATININE 0.61  --  0.46  CALCIUM 9.3  --  9.3  MG  --  1.9  --    Liver Function Tests:  Recent Labs Lab 07/25/16 0938 07/26/16 0717  AST 23 17  ALT 15 13*  ALKPHOS 78 73  BILITOT 1.9* 0.6  PROT 7.7 6.8  ALBUMIN 4.1 3.4*   No results for input(s): LIPASE, AMYLASE in the last 168 hours. No results for input(s): AMMONIA in the last 168 hours. CBC:  Recent Labs Lab 07/25/16 0908 07/26/16 0717  WBC 16.7* 22.1*  HGB 14.4 12.0  HCT 39.4 34.4*  MCV 94.0 94.2  PLT 353 345   Cardiac Enzymes:  Recent Labs Lab 07/25/16 0908  TROPONINI <0.03   BNP: Invalid input(s): POCBNP CBG: No results for input(s): GLUCAP in the last 168 hours. D-Dimer No results for input(s): DDIMER in the last 72 hours. Hgb A1c No results for input(s): HGBA1C in the last 72 hours. Lipid Profile No results for input(s): CHOL, HDL, LDLCALC, TRIG, CHOLHDL, LDLDIRECT in the last 72 hours. Thyroid function studies No results for input(s): TSH, T4TOTAL, T3FREE, THYROIDAB in the last 72 hours.  Invalid input(s): FREET3 Anemia work up No results for input(s): VITAMINB12, FOLATE, FERRITIN, TIBC, IRON, RETICCTPCT in the last 72 hours. Urinalysis No results found for: COLORURINE, APPEARANCEUR, LABSPEC, PHURINE, GLUCOSEU, HGBUR, BILIRUBINUR, KETONESUR, PROTEINUR, UROBILINOGEN, NITRITE, LEUKOCYTESUR Sepsis Labs Invalid input(s): PROCALCITONIN,  WBC,  LACTICIDVEN   Time coordinating discharge: Over 30 minutes  SIGNED:   Renae Fickle, MD  [facility] 07/27/2016, 1:03 PM Pager   If 7PM-7AM, please contact night-coverage [email or web address] Password [email or web address]

## 2016-07-27 NOTE — Op Note (Signed)
[facility] Cardiopulmonary Patient Name: [name] Procedure Date: 07/27/2016 MRN: [id number] Attending MD: Juanito Doom , MD Date of Birth: 1966/03/24 CSN: [id number] Age: 51 Admit Type: Inpatient Ethnicity: Not Hispanic or Latino Procedure:            Bronchoscopy Indications:          Bronchus foreign body Providers:            Nathaneil Canary B. Lake Bells, MD, Andre Lefort RRT,RCP,                        Ashley Mariner RRT,RCP Referring MD:          Medicines:            Fentanyl 100 mcg IV, Midazolam 6 mg IV, Lidocaine 1%                        applied to cords 6 mL, Lidocaine 1% subglottic space 3                        mL Complications:        No immediate complications Estimated Blood Loss: Estimated blood loss: none. Procedure:      Pre-Anesthesia Assessment:      - A History and Physical has been performed. Patient meds and allergies       have been reviewed. The risks and benefits of the procedure and the       sedation options and risks were discussed with the patient. All       questions were answered and informed consent was obtained. Patient       identification and proposed procedure were verified prior to the       procedure by the physician, the nurse and the technician in the       procedure room. Mental Status Examination: alert and oriented. Airway       Examination: normal oropharyngeal airway. Respiratory Examination: poor       air movement in the right lung. CV Examination: RRR, no murmurs, no S3       or S4. ASA Grade Assessment: II - A patient with mild systemic disease.       After reviewing the risks and benefits, the patient was deemed in       satisfactory condition to undergo the procedure. The anesthesia plan was       to use moderate sedation / analgesia (conscious sedation). Immediately       prior to administration of medications, the patient was re-assessed for       adequacy to receive sedatives. The heart rate, respiratory  rate, oxygen       saturations, blood pressure, adequacy of pulmonary ventilation, and       response to care were monitored throughout the procedure. The physical       status of the patient was re-assessed after the procedure.      After obtaining informed consent, the bronchoscope was passed under       direct vision. Throughout the procedure, the patient's blood pressure,       pulse, and oxygen saturations were monitored continuously. the [id number]       ([id number]) scope was introduced through the mouth and advanced to the       tracheobronchial tree. The procedure was accomplished without       difficulty. The patient tolerated the procedure  well. The total duration       of the procedure was 5 minutes. Findings:      The nasopharynx/oropharynx appears normal. The larynx appears normal.       The vocal cords appear normal. The subglottic space is normal. The       trachea is of normal caliber. The carina is sharp. The tracheobronchial       tree of the left lung was examined to at least the first subsegmental       level. Bronchial mucosa and anatomy in the left lung are normal; there       are no endobronchial lesions, and no secretions.      Right Lung Abnormalities: Green Pea was found in the bronchus       intermedius obstructing the airway (see photo). The pea was grasped with       suction and removed to the patient's mouth. In the setting of vigorous       cough the pea came loose from the scope and the patient swallowed the       pea. At this point I re-entered the airway and performed a full       tracheobronchial tree exam, looking at all sub segments carefully. All       airways were free of foreign body or mass. Scant, white, thin secretions       were found throughout the right tracheobronchial tree. They were not       obstructing the airway. BAL was performed in the right middle lobe of       the lung and sent for aerobic culture and anaerobic culture. 60 mL of        fluid were instilled. 24 mL were returned. The return was cloudy. There       were no mucoid plugs in the return fluid.      The scope was removed from the airway and careful inspection of the       larnx, hypopharynx and oral cavity revealed no evidence of the foreign       body. Impression:      - Bronchus foreign body      - The left lung was normal.      - Green Pea was found in the bronchus intermedius.      - Scant, white, thin secretions were found throughout the       tracheobronchial tree.      - No specimens collected. Moderate Sedation:      Moderate (conscious) sedation was personally administered by the       endoscopist. The following parameters were monitored: oxygen saturation,       heart rate, blood pressure, and response to care. Total physician       intraservice time was 15 minutes. Recommendation:      - Await BAL results. Procedure Code(s):      --- Professional ---      [phone], Bronchoscopy, rigid or flexible, including fluoroscopic guidance,       when performed; with bronchial alveolar lavage      99152, Moderate sedation services provided by the same physician or       other qualified health care professional performing the diagnostic or       therapeutic service that the sedation supports, requiring the presence       of an independent trained observer to assist in the monitoring of the       patient's level  of consciousness and physiological status; initial 15       minutes of intraservice time, patient age 79 years or older Diagnosis Code(s):      --- Professional ---      T17.508A, Unspecified foreign body in bronchus causing other injury,       initial encounter      R09.89, Other specified symptoms and signs involving the circulatory and       respiratory systems CPT copyright 2016 [employer] Association. All rights reserved. The codes documented in this report are preliminary and upon coder review may  be revised to meet current compliance  requirements. Norlene Campbell, MD Juanito Doom, MD 07/27/2016 8:57:43 AM This report has been signed electronically. Number of Addenda: 0 Scope In: 8:37:04 AM Scope Out: 8:41:48 AM
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
## 2016-07-27 NOTE — Progress Notes (Signed)
Video Bronchoscopy done  Intervention Bronchial washing done Removal of foreign object  Procedure tolerated well

## 2016-07-28 ENCOUNTER — Encounter (HOSPITAL_COMMUNITY): Payer: Self-pay | Admitting: Pulmonary Disease

## 2016-07-29 LAB — CULTURE, RESPIRATORY: CULTURE: NORMAL

## 2016-07-29 LAB — CULTURE, RESPIRATORY W GRAM STAIN: Special Requests: NORMAL

## 2016-07-30 LAB — CULTURE, BLOOD (ROUTINE X 2)
CULTURE: NO GROWTH
CULTURE: NO GROWTH

## 2016-08-04 ENCOUNTER — Other Ambulatory Visit: Payer: Self-pay | Admitting: Family Medicine

## 2016-08-04 DIAGNOSIS — N63 Unspecified lump in unspecified breast: Secondary | ICD-10-CM

## 2016-08-08 ENCOUNTER — Ambulatory Visit
Admission: RE | Admit: 2016-08-08 | Discharge: 2016-08-08 | Disposition: A | Discharge status: Home / Self Care | Payer: BLUE CROSS/BLUE SHIELD | Source: Ambulatory Visit | Attending: Family Medicine | Admitting: Family Medicine

## 2016-08-08 DIAGNOSIS — N63 Unspecified lump in unspecified breast: Secondary | ICD-10-CM

## 2017-09-18 ENCOUNTER — Ambulatory Visit: Payer: BLUE CROSS/BLUE SHIELD | Admitting: Physician Assistant

## 2019-09-04 ENCOUNTER — Ambulatory Visit: Payer: Self-pay | Attending: Internal Medicine

## 2019-09-04 DIAGNOSIS — Z20822 Contact with and (suspected) exposure to covid-19: Secondary | ICD-10-CM

## 2019-09-05 LAB — SARS-COV-2, NAA 2 DAY TAT

## 2019-09-05 LAB — NOVEL CORONAVIRUS, NAA: SARS-CoV-2, NAA: NOT DETECTED

## 2022-11-07 ENCOUNTER — Emergency Department (HOSPITAL_COMMUNITY)
Admission: EM | Admit: 2022-11-07 | Discharge: 2022-11-07 | Disposition: A | Discharge status: Home / Self Care | Payer: Worker's Compensation | Attending: Emergency Medicine | Admitting: Emergency Medicine

## 2022-11-07 ENCOUNTER — Other Ambulatory Visit: Payer: Self-pay

## 2022-11-07 DIAGNOSIS — T22211A Burn of second degree of right forearm, initial encounter: Secondary | ICD-10-CM | POA: Diagnosis present

## 2022-11-07 DIAGNOSIS — T310 Burns involving less than 10% of body surface: Secondary | ICD-10-CM | POA: Insufficient documentation

## 2022-11-07 DIAGNOSIS — X140XXA Inhalation of hot air and gases, initial encounter: Secondary | ICD-10-CM | POA: Insufficient documentation

## 2022-11-07 MED ORDER — OXYCODONE-ACETAMINOPHEN 5-325 MG PO TABS
1.0000 | ORAL_TABLET | Freq: Once | ORAL | Status: AC
  Administered 2022-11-07: 1 via ORAL
  Filled 2022-11-07: qty 1

## 2022-11-07 MED ORDER — OXYCODONE-ACETAMINOPHEN 5-325 MG PO TABS: 1.0000 | ORAL_TABLET | Freq: Three times a day (TID) | ORAL | 0 refills | Status: AC | PRN
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
